# Patient Record
Sex: Female | Born: 1945 | Race: White | Hispanic: No | Smoking: Former smoker
Health system: Southern US, Community
[De-identification: ages and names within clinical notes are randomized; demographics above are authoritative.]

## PROBLEM LIST (undated history)

## (undated) DIAGNOSIS — IMO0002 Reserved for concepts with insufficient information to code with codable children: Secondary | ICD-10-CM

## (undated) DIAGNOSIS — M1990 Unspecified osteoarthritis, unspecified site: Secondary | ICD-10-CM

## (undated) DIAGNOSIS — D0510 Intraductal carcinoma in situ of unspecified breast: Secondary | ICD-10-CM

## (undated) DIAGNOSIS — R0602 Shortness of breath: Secondary | ICD-10-CM

## (undated) DIAGNOSIS — IMO0001 Reserved for inherently not codable concepts without codable children: Secondary | ICD-10-CM

## (undated) DIAGNOSIS — Z8489 Family history of other specified conditions: Secondary | ICD-10-CM

## (undated) DIAGNOSIS — I10 Essential (primary) hypertension: Secondary | ICD-10-CM

## (undated) DIAGNOSIS — H1189 Other specified disorders of conjunctiva: Secondary | ICD-10-CM

## (undated) DIAGNOSIS — J40 Bronchitis, not specified as acute or chronic: Secondary | ICD-10-CM

## (undated) DIAGNOSIS — C50919 Malignant neoplasm of unspecified site of unspecified female breast: Secondary | ICD-10-CM

## (undated) DIAGNOSIS — K219 Gastro-esophageal reflux disease without esophagitis: Secondary | ICD-10-CM

## (undated) DIAGNOSIS — E559 Vitamin D deficiency, unspecified: Secondary | ICD-10-CM

## (undated) DIAGNOSIS — E785 Hyperlipidemia, unspecified: Secondary | ICD-10-CM

## (undated) DIAGNOSIS — I499 Cardiac arrhythmia, unspecified: Secondary | ICD-10-CM

## (undated) DIAGNOSIS — Z923 Personal history of irradiation: Secondary | ICD-10-CM

## (undated) HISTORY — DX: Hyperlipidemia, unspecified: E78.5

## (undated) HISTORY — DX: Essential (primary) hypertension: I10

## (undated) HISTORY — DX: Cardiac arrhythmia, unspecified: I49.9

## (undated) HISTORY — DX: Vitamin D deficiency, unspecified: E55.9

## (undated) HISTORY — PX: BREAST BIOPSY: SHX20

## (undated) HISTORY — PX: TONSILLECTOMY: SUR1361

## (undated) HISTORY — DX: Reserved for inherently not codable concepts without codable children: IMO0001

## (undated) HISTORY — DX: Gilbert syndrome: E80.4

## (undated) HISTORY — DX: Other specified disorders of conjunctiva: H11.89

## (undated) HISTORY — PX: ABDOMINAL HYSTERECTOMY: SHX81

## (undated) HISTORY — DX: Malignant neoplasm of unspecified site of unspecified female breast: C50.919

## (undated) HISTORY — DX: Reserved for concepts with insufficient information to code with codable children: IMO0002

---

## 1980-10-12 HISTORY — PX: BREAST LUMPECTOMY: SHX2

## 2012-01-04 ENCOUNTER — Encounter (INDEPENDENT_AMBULATORY_CARE_PROVIDER_SITE_OTHER): Payer: Self-pay | Admitting: Surgery

## 2012-01-04 ENCOUNTER — Ambulatory Visit (INDEPENDENT_AMBULATORY_CARE_PROVIDER_SITE_OTHER): Payer: Medicare Other | Admitting: Surgery

## 2012-01-04 VITALS — BP 138/78 | HR 72 | Temp 97.2°F | Resp 18 | Ht 63.0 in | Wt 188.2 lb

## 2012-01-04 DIAGNOSIS — D0590 Unspecified type of carcinoma in situ of unspecified breast: Secondary | ICD-10-CM

## 2012-01-04 DIAGNOSIS — D0510 Intraductal carcinoma in situ of unspecified breast: Secondary | ICD-10-CM

## 2012-01-04 NOTE — Patient Instructions (Signed)
DUCTAL CARCINOMA IN SITU Return after MRI to further discuss surgery.

## 2012-01-04 NOTE — Progress Notes (Signed)
Patient ID: Paula [name], female   DOB: October 10, 1946, 66 y.o.   MRN: [id number]  Chief Complaint  Patient presents with  . Breast Cancer    eval Rt breast    HPI Paula [name] is a 66 y.o. female.   HPI The patient is sent at the request of Dr.Heist of [place]. The patient was noted to have a cluster of right breast microcalcifications in the upper-outer quadrant or pleomorphic. 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 Denies any history of breast mass. No family history of breast cancer. No history of breast pain, nipple discharge or mass. History of left breast biopsy 20 years ago for fibrocystic change.  Past Medical History  Diagnosis Date  . Cancer     rt breast  . Hypertension   . Hyperlipidemia   . Gilbert's syndrome   . Conjunctival disorder, other     Past Surgical History  Procedure Date  . Tonsillectomy   . Breast biopsy     right breast    Family History  Problem Relation Age of Onset  . Heart disease Father   . Heart disease Sister   . Heart disease Brother   . Cancer Maternal Aunt     [place]  . Heart disease Maternal Uncle     Social History History  Substance Use Topics  . Smoking status: Former Games developer  . Smokeless tobacco: Not on file  . Alcohol Use: No    Allergies  Allergen Reactions  . Amoxicillin Rash  . Ceclor (Cefaclor) Rash  . Doxycycline Rash    Current Outpatient Prescriptions  Medication Sig Dispense Refill  . albuterol (PROVENTIL HFA;VENTOLIN HFA) 108 (90 BASE) MCG/ACT inhaler Inhale 2 puffs into the lungs every 6 (six) hours as needed.      . beclomethasone (QVAR) 40 MCG/ACT inhaler Inhale 2 puffs into the lungs 2 (two) times daily.      Marland Kitchen CALCIUM PO Take by mouth.      . Fexofenadine HCl (ALLEGRA PO) Take by mouth daily.      . fish oil-omega-3 fatty acids 1000 MG capsule Take 2 g by mouth daily.      Marland Kitchen losartan-hydrochlorothiazide (HYZAAR) 100-25 MG per tablet Take 1  tablet by mouth daily.      . Misc Natural Products (PROGESTERONE EX) Apply topically as needed.      . mometasone (NASONEX) 50 MCG/ACT nasal spray Place 2 sprays into the nose daily.      . montelukast (SINGULAIR) 10 MG tablet Take 10 mg by mouth at bedtime.      . Multiple Vitamin (MULTIVITAMIN) capsule Take 1 capsule by mouth daily.      . Multiple Vitamins-Iron (CHLORELLA PO) Take by mouth.      . predniSONE (DELTASONE) 10 MG tablet as directed.      . simvastatin (ZOCOR) 10 MG tablet Take 10 mg by mouth at bedtime.      Marland Kitchen VITAMIN E PO Take by mouth daily.        Review of Systems Review of Systems  Constitutional: Negative for fever, chills and unexpected weight change.  HENT: Negative for hearing loss, congestion, sore throat, trouble swallowing and voice change.   Eyes: Negative for visual disturbance.  Respiratory: Negative for cough and wheezing.   Cardiovascular: Negative for chest pain, palpitations and leg swelling.  Gastrointestinal: Negative for nausea, vomiting, abdominal pain, diarrhea, constipation, blood in stool, abdominal distention and anal bleeding.  Genitourinary: Negative for hematuria, vaginal bleeding and difficulty urinating.  Musculoskeletal: Negative for arthralgias.  Skin: Negative for rash and wound.  Neurological: Negative for seizures, syncope and headaches.  Hematological: Negative for adenopathy. Does not bruise/bleed easily.  Psychiatric/Behavioral: Negative for confusion.    Blood pressure 138/78, pulse 72, temperature 97.2 F (36.2 C), temperature source Temporal, resp. rate 18, height 5\' 3"  (1.6 m), weight 188 lb 3.2 oz (85.367 kg).  Physical Exam Physical Exam  Constitutional: She appears well-developed and well-nourished.  HENT:  Head: Normocephalic and atraumatic.  Eyes: EOM are normal. Pupils are equal, round, and reactive to light.  Neck: Normal range of motion. Neck supple.  Cardiovascular: Normal rate and regular rhythm.     Pulmonary/Chest: Effort normal and breath sounds normal.       Right breast with small hematoma upper outer quadrant.  No other masses bilaterally.  No axillary lymphadenopathy.  Abdominal: Soft. Bowel sounds are normal.  Musculoskeletal: Normal range of motion.  Skin: Skin is warm and dry.  Psychiatric: She has a normal mood and affect. Her behavior is normal. Judgment and thought content normal.    Data Reviewed Mammogram and right breast biopsy results.  1 cm cluster right breast micro calcifications upper outer quadrant.  DCIS with comedo necrosis ER PR positive  Assessment    Right breast DCIS upper outer quadrant    Plan    We'll arrange for breast MRI. Return to clinic after this is done to discuss surgical options.       Artesha Wemhoff A. 01/04/2012, 2:56 PM

## 2012-01-11 ENCOUNTER — Ambulatory Visit
Admission: RE | Admit: 2012-01-11 | Discharge: 2012-01-11 | Disposition: A | Discharge status: Home / Self Care | Payer: Medicare Other | Source: Ambulatory Visit | Attending: Surgery | Admitting: Surgery

## 2012-01-11 DIAGNOSIS — D0510 Intraductal carcinoma in situ of unspecified breast: Secondary | ICD-10-CM

## 2012-01-11 MED ORDER — GADOBENATE DIMEGLUMINE 529 MG/ML IV SOLN
17.0000 mL | Freq: Once | INTRAVENOUS | Status: AC | PRN
  Administered 2012-01-11: 17 mL via INTRAVENOUS

## 2012-01-12 ENCOUNTER — Telehealth (INDEPENDENT_AMBULATORY_CARE_PROVIDER_SITE_OTHER): Payer: Self-pay | Admitting: Surgery

## 2012-01-12 NOTE — Telephone Encounter (Signed)
Patient called and would like to know if she will need to return to the office prior to surgery.

## 2012-01-22 ENCOUNTER — Ambulatory Visit (INDEPENDENT_AMBULATORY_CARE_PROVIDER_SITE_OTHER): Payer: Medicare Other | Admitting: Surgery

## 2012-01-22 ENCOUNTER — Encounter (INDEPENDENT_AMBULATORY_CARE_PROVIDER_SITE_OTHER): Payer: Self-pay | Admitting: Surgery

## 2012-01-22 VITALS — BP 140/82 | Ht 63.0 in | Wt 189.8 lb

## 2012-01-22 DIAGNOSIS — D0590 Unspecified type of carcinoma in situ of unspecified breast: Secondary | ICD-10-CM

## 2012-01-22 DIAGNOSIS — D0510 Intraductal carcinoma in situ of unspecified breast: Secondary | ICD-10-CM

## 2012-01-22 NOTE — Progress Notes (Signed)
Patient ID: Paula [name], female   DOB: 12/05/1945, 65 y.o.   MRN: [id number]  No chief complaint on file.   HPI Paula [name] is a 65 y.o. female.   HPI The patient is sent at the request of Dr.Heist of [place]. The patient was noted to have a cluster of right breast microcalcifications in the upper-outer quadrant or pleomorphic. 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 Denies any history of breast mass. No family history of breast cancer. No history of breast pain, nipple discharge or mass. History of left breast biopsy 20 years ago for fibrocystic change. She returns to  Talk about MRI results and treatment optons.  Past Medical History  Diagnosis Date  . Cancer     rt breast  . Hypertension   . Hyperlipidemia   . Gilbert's syndrome   . Conjunctival disorder, other     Past Surgical History  Procedure Date  . Tonsillectomy   . Breast biopsy     right breast    Family History  Problem Relation Age of Onset  . Heart disease Father   . Heart disease Sister   . Heart disease Brother   . Cancer Maternal Aunt     [place]  . Heart disease Maternal Uncle     Social History History  Substance Use Topics  . Smoking status: Former Smoker  . Smokeless tobacco: Not on file  . Alcohol Use: No    Allergies  Allergen Reactions  . Amoxicillin Rash  . Ceclor (Cefaclor) Rash  . Doxycycline Rash    Current Outpatient Prescriptions  Medication Sig Dispense Refill  . albuterol (PROVENTIL HFA;VENTOLIN HFA) 108 (90 BASE) MCG/ACT inhaler Inhale 2 puffs into the lungs every 6 (six) hours as needed.      . beclomethasone (QVAR) 40 MCG/ACT inhaler Inhale 2 puffs into the lungs 2 (two) times daily.      . CALCIUM PO Take by mouth.      . Fexofenadine HCl (ALLEGRA PO) Take by mouth daily.      . fish oil-omega-3 fatty acids 1000 MG capsule Take 2 g by mouth daily.      . losartan-hydrochlorothiazide (HYZAAR) 100-25 MG per tablet  Take 1 tablet by mouth daily.      . Misc Natural Products (PROGESTERONE EX) Apply topically as needed.      . mometasone (NASONEX) 50 MCG/ACT nasal spray Place 2 sprays into the nose daily.      . montelukast (SINGULAIR) 10 MG tablet Take 10 mg by mouth at bedtime.      . Multiple Vitamin (MULTIVITAMIN) capsule Take 1 capsule by mouth daily.      . Multiple Vitamins-Iron (CHLORELLA PO) Take by mouth.      . predniSONE (DELTASONE) 10 MG tablet as directed.      . simvastatin (ZOCOR) 10 MG tablet Take 10 mg by mouth at bedtime.      . VITAMIN E PO Take by mouth daily.        Review of Systems Review of Systems  Constitutional: Negative for fever, chills and unexpected weight change.  HENT: Negative for hearing loss, congestion, sore throat, trouble swallowing and voice change.   Eyes: Negative for visual disturbance.  Respiratory: Negative for cough and wheezing.   Cardiovascular: Negative for chest pain, palpitations and leg swelling.  Gastrointestinal: Negative for nausea, vomiting, abdominal pain, diarrhea, constipation, blood in stool, abdominal distention and anal bleeding.  Genitourinary:   Negative for hematuria, vaginal bleeding and difficulty urinating.  Musculoskeletal: Negative for arthralgias.  Skin: Negative for rash and wound.  Neurological: Negative for seizures, syncope and headaches.  Hematological: Negative for adenopathy. Does not bruise/bleed easily.  Psychiatric/Behavioral: Negative for confusion.    Blood pressure 140/82, height 5' 3" (1.6 m), weight 189 lb 12.8 oz (86.093 kg).  Physical Exam Physical Exam  Constitutional: She appears well-developed and well-nourished.  HENT:  Head: Normocephalic and atraumatic.  Eyes: EOM are normal. Pupils are equal, round, and reactive to light.  Neck: Normal range of motion. Neck supple.  Cardiovascular: Normal rate and regular rhythm.   Pulmonary/Chest: Effort normal and breath sounds normal.       Right breast with small  hematoma upper outer quadrant.  No other masses bilaterally.  No axillary lymphadenopathy.  Abdominal: Soft. Bowel sounds are normal.  Musculoskeletal: Normal range of motion.  Skin: Skin is warm and dry.  Psychiatric: She has a normal mood and affect. Her behavior is normal. Judgment and thought content normal.    Data Reviewed Mammogram and right breast biopsy results.  1 cm cluster right breast micro calcifications upper outer quadrant.  DCIS with comedo necrosis ER PR positive  MRI shows no additional enhancement.  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 Alternatives to surgery have been discussed with the patient.  Risks of surgery include bleeding,  Infection,  Seroma formation, death,  and the need for further surgery.   The patient understands and wishes to proceed.       Jair Lindblad A. 01/22/2012, 12:33 PM    

## 2012-01-22 NOTE — Patient Instructions (Signed)
Lumpectomy, Breast Conserving Surgery A lumpectomy is breast surgery that removes only part of the breast. Another name used may be partial mastectomy. The amount removed varies. Make sure you understand how much of your breast will be removed. Reasons for a lumpectomy:  Any solid breast mass.   Grouped significant nodularity that may be confused with a solitary breast mass.  Lumpectomy is the most common form of breast cancer surgery today. The surgeon removes the portion of your breast which contains the tumor (cancer). This is the lump. Some normal tissue around the lump is also removed to be sure that all the tumor has been removed.  If cancer cells are found in the margins where the breast tissue was removed, your surgeon will do more surgery to remove the remaining cancer tissue. This is called re-excision surgery. Radiation and/or chemotherapy treatments are often given following a lumpectomy to kill any cancer cells that could possibly remain.  REASONS YOU MAY NOT BE ABLE TO HAVE BREAST CONSERVING SURGERY:  The tumor is located in more than one place.   Your breast is small and the tumor is large so the breast would be disfigured.   The entire tumor removal is not successful with a lumpectomy.   You cannot commit to a full course of chemotherapy, radiation therapy or are pregnant and cannot have radiation.   You have previously had radiation to the breast to treat cancer.  HOW A LUMPECTOMY IS PERFORMED If overnight nursing is not required following a biopsy, a lumpectomy can be performed as a same-day surgery. This can be done in a hospital, clinic, or surgical center. The anesthesia used will depend on your surgeon. They will discuss this with you. A general anesthetic keeps you sleeping through the procedure. LET YOUR CAREGIVERS KNOW ABOUT THE FOLLOWING:  Allergies   Medications taken including herbs, eye drops, over the counter medications, and creams.   Use of steroids (by  mouth or creams)   Previous problems with anesthetics or Novocaine.   Possibility of pregnancy, if this applies   History of blood clots (thrombophlebitis)   History of bleeding or blood problems.   Previous surgery   Other health problems  BEFORE THE PROCEDURE You should be present one hour prior to your procedure unless directed otherwise.  AFTER THE PROCEDURE  After surgery, you will be taken to the recovery area where a nurse will watch and check your progress. Once you're awake, stable, and taking fluids well, barring other problems you will be allowed to go home.   Ice packs applied to your operative site may help with discomfort and keep the swelling down.   A small rubber drain may be placed in the breast for a couple of days to prevent a hematoma from developing in the breast.   A pressure dressing may be applied for 24 to 48 hours to prevent bleeding.   Keep the wound dry.   You may resume a normal diet and activities as directed. Avoid strenuous activities affecting the arm on the side of the biopsy site such as tennis, swimming, heavy lifting (more than 10 pounds) or pulling.   Bruising in the breast is normal following this procedure.   Wearing a bra - even to bed - may be more comfortable and also help keep the dressing on.   Change dressings as directed.   Only take over-the-counter or prescription medicines for pain, discomfort, or fever as directed by your caregiver.  Call for your results as   instructed by your surgeon. Remember it is your responsibility to get the results of your lumpectomy if your surgeon asked you to follow-up. Do not assume everything is fine if you have not heard from your caregiver. SEEK MEDICAL CARE IF:   There is increased bleeding (more than a small spot) from the wound.   You notice redness, swelling, or increasing pain in the wound.   Pus is coming from wound.   An unexplained oral temperature above 102 F (38.9 C) develops.     You notice a foul smell coming from the wound or dressing.  SEEK IMMEDIATE MEDICAL CARE IF:   You develop a rash.   You have difficulty breathing.   You have any allergic problems.  Document Released: 11/09/2006 Document Revised: 09/17/2011 Document Reviewed: 02/10/2007 [employer] Patient Information 2012 [employer]. 

## 2012-01-25 ENCOUNTER — Other Ambulatory Visit (INDEPENDENT_AMBULATORY_CARE_PROVIDER_SITE_OTHER): Payer: Self-pay | Admitting: Surgery

## 2012-01-25 DIAGNOSIS — D0510 Intraductal carcinoma in situ of unspecified breast: Secondary | ICD-10-CM

## 2012-02-02 ENCOUNTER — Encounter (INDEPENDENT_AMBULATORY_CARE_PROVIDER_SITE_OTHER): Payer: Medicare Other | Admitting: Surgery

## 2012-02-03 ENCOUNTER — Encounter (HOSPITAL_BASED_OUTPATIENT_CLINIC_OR_DEPARTMENT_OTHER): Payer: Self-pay | Admitting: *Deleted

## 2012-02-03 NOTE — Progress Notes (Signed)
To come in for ccs labs and cxr,ekg

## 2012-02-04 ENCOUNTER — Other Ambulatory Visit: Payer: Self-pay

## 2012-02-04 ENCOUNTER — Encounter (HOSPITAL_BASED_OUTPATIENT_CLINIC_OR_DEPARTMENT_OTHER)
Admission: RE | Admit: 2012-02-04 | Discharge: 2012-02-04 | Disposition: A | Discharge status: Home / Self Care | Payer: Medicare Other | Source: Ambulatory Visit | Attending: Surgery | Admitting: Surgery

## 2012-02-04 ENCOUNTER — Ambulatory Visit
Admission: RE | Admit: 2012-02-04 | Discharge: 2012-02-04 | Disposition: A | Discharge status: Home / Self Care | Payer: Medicare Other | Source: Ambulatory Visit | Attending: Surgery | Admitting: Surgery

## 2012-02-04 LAB — DIFFERENTIAL
Basophils Absolute: 0 10*3/uL (ref 0.0–0.1)
Lymphocytes Relative: 32 % (ref 12–46)
Monocytes Absolute: 0.6 10*3/uL (ref 0.1–1.0)
Neutro Abs: 3.3 10*3/uL (ref 1.7–7.7)

## 2012-02-04 LAB — COMPREHENSIVE METABOLIC PANEL
AST: 21 U/L (ref 0–37)
Albumin: 4 g/dL (ref 3.5–5.2)
CO2: 27 mEq/L (ref 19–32)
Calcium: 9.6 mg/dL (ref 8.4–10.5)
Creatinine, Ser: 0.73 mg/dL (ref 0.50–1.10)
GFR calc non Af Amer: 88 mL/min — ABNORMAL LOW (ref >90)
Total Protein: 6.9 g/dL (ref 6.0–8.3)

## 2012-02-04 LAB — CBC
MCH: 30.8 pg (ref 26.0–34.0)
MCHC: 33.5 g/dL (ref 30.0–36.0)
MCV: 91.8 fL (ref 78.0–100.0)
Platelets: 282 10*3/uL (ref 150–400)
RDW: 13.4 % (ref 11.5–15.5)

## 2012-02-08 ENCOUNTER — Encounter (HOSPITAL_BASED_OUTPATIENT_CLINIC_OR_DEPARTMENT_OTHER): Payer: Self-pay | Admitting: Anesthesiology

## 2012-02-08 ENCOUNTER — Encounter (HOSPITAL_BASED_OUTPATIENT_CLINIC_OR_DEPARTMENT_OTHER): Payer: Self-pay | Admitting: *Deleted

## 2012-02-08 ENCOUNTER — Ambulatory Visit
Admission: RE | Admit: 2012-02-08 | Discharge: 2012-02-08 | Disposition: A | Discharge status: Home / Self Care | Payer: Medicare Other | Source: Ambulatory Visit | Attending: Surgery | Admitting: Surgery

## 2012-02-08 ENCOUNTER — Encounter (HOSPITAL_BASED_OUTPATIENT_CLINIC_OR_DEPARTMENT_OTHER)
Admission: RE | Disposition: A | Discharge status: Home / Self Care | Payer: Self-pay | Source: Ambulatory Visit | Attending: Surgery

## 2012-02-08 ENCOUNTER — Encounter (HOSPITAL_BASED_OUTPATIENT_CLINIC_OR_DEPARTMENT_OTHER): Payer: Self-pay | Admitting: Surgery

## 2012-02-08 ENCOUNTER — Ambulatory Visit (HOSPITAL_BASED_OUTPATIENT_CLINIC_OR_DEPARTMENT_OTHER)
Admission: RE | Admit: 2012-02-08 | Discharge: 2012-02-08 | Disposition: A | Discharge status: Home / Self Care | Payer: Medicare Other | Source: Ambulatory Visit | Attending: Surgery | Admitting: Surgery

## 2012-02-08 ENCOUNTER — Ambulatory Visit (HOSPITAL_BASED_OUTPATIENT_CLINIC_OR_DEPARTMENT_OTHER): Payer: Medicare Other | Admitting: Anesthesiology

## 2012-02-08 DIAGNOSIS — E785 Hyperlipidemia, unspecified: Secondary | ICD-10-CM | POA: Insufficient documentation

## 2012-02-08 DIAGNOSIS — M129 Arthropathy, unspecified: Secondary | ICD-10-CM | POA: Insufficient documentation

## 2012-02-08 DIAGNOSIS — K219 Gastro-esophageal reflux disease without esophagitis: Secondary | ICD-10-CM | POA: Insufficient documentation

## 2012-02-08 DIAGNOSIS — D0510 Intraductal carcinoma in situ of unspecified breast: Secondary | ICD-10-CM

## 2012-02-08 DIAGNOSIS — Z170 Estrogen receptor positive status [ER+]: Secondary | ICD-10-CM | POA: Insufficient documentation

## 2012-02-08 DIAGNOSIS — R0602 Shortness of breath: Secondary | ICD-10-CM | POA: Insufficient documentation

## 2012-02-08 DIAGNOSIS — D0590 Unspecified type of carcinoma in situ of unspecified breast: Secondary | ICD-10-CM

## 2012-02-08 DIAGNOSIS — I10 Essential (primary) hypertension: Secondary | ICD-10-CM | POA: Insufficient documentation

## 2012-02-08 HISTORY — DX: Bronchitis, not specified as acute or chronic: J40

## 2012-02-08 HISTORY — DX: Unspecified osteoarthritis, unspecified site: M19.90

## 2012-02-08 HISTORY — DX: Shortness of breath: R06.02

## 2012-02-08 HISTORY — DX: Gastro-esophageal reflux disease without esophagitis: K21.9

## 2012-02-08 SURGERY — BREAST LUMPECTOMY WITH NEEDLE LOCALIZATION
Anesthesia: General | Site: Breast | Laterality: Right | Wound class: Clean

## 2012-02-08 MED ORDER — BUPIVACAINE-EPINEPHRINE 0.25% -1:200000 IJ SOLN
INTRAMUSCULAR | Status: DC | PRN
  Administered 2012-02-08: 12 mL

## 2012-02-08 MED ORDER — MIDAZOLAM HCL 5 MG/5ML IJ SOLN
INTRAMUSCULAR | Status: DC | PRN
  Administered 2012-02-08: 1 mg via INTRAVENOUS

## 2012-02-08 MED ORDER — VANCOMYCIN HCL IN DEXTROSE 1-5 GM/200ML-% IV SOLN
1000.0000 mg | INTRAVENOUS | Status: AC
  Administered 2012-02-08: 1000 mg via INTRAVENOUS

## 2012-02-08 MED ORDER — PROPOFOL 10 MG/ML IV EMUL
INTRAVENOUS | Status: DC | PRN
  Administered 2012-02-08: 200 mg via INTRAVENOUS

## 2012-02-08 MED ORDER — ONDANSETRON HCL 4 MG/2ML IJ SOLN
INTRAMUSCULAR | Status: DC | PRN
  Administered 2012-02-08: 4 mg via INTRAVENOUS

## 2012-02-08 MED ORDER — ONDANSETRON HCL 4 MG/2ML IJ SOLN: 4.0000 mg | Freq: Four times a day (QID) | INTRAMUSCULAR | Status: DC | PRN

## 2012-02-08 MED ORDER — LIDOCAINE HCL (CARDIAC) 20 MG/ML IV SOLN
INTRAVENOUS | Status: DC | PRN
  Administered 2012-02-08: 100 mg via INTRAVENOUS

## 2012-02-08 MED ORDER — OXYCODONE-ACETAMINOPHEN 5-325 MG PO TABS: 1.0000 | ORAL_TABLET | ORAL | Status: AC | PRN

## 2012-02-08 MED ORDER — HYDROMORPHONE HCL PF 1 MG/ML IJ SOLN
0.2500 mg | INTRAMUSCULAR | Status: DC | PRN
Start: 2012-02-08 — End: 2012-02-08

## 2012-02-08 MED ORDER — DEXAMETHASONE SODIUM PHOSPHATE 4 MG/ML IJ SOLN
INTRAMUSCULAR | Status: DC | PRN
  Administered 2012-02-08: 10 mg via INTRAVENOUS

## 2012-02-08 MED ORDER — FENTANYL CITRATE 0.05 MG/ML IJ SOLN
INTRAMUSCULAR | Status: DC | PRN
  Administered 2012-02-08: 100 ug via INTRAVENOUS
  Administered 2012-02-08 (×2): 25 ug via INTRAVENOUS

## 2012-02-08 MED ORDER — LACTATED RINGERS IV SOLN
INTRAVENOUS | Status: DC
  Administered 2012-02-08 (×2): via INTRAVENOUS

## 2012-02-08 MED ORDER — CHLORHEXIDINE GLUCONATE 4 % EX LIQD: 1.0000 "application " | Freq: Once | CUTANEOUS | Status: DC

## 2012-02-08 MED ORDER — OXYCODONE-ACETAMINOPHEN 5-325 MG PO TABS
1.0000 | ORAL_TABLET | Freq: Once | ORAL | Status: AC
  Administered 2012-02-08: 1 via ORAL

## 2012-02-08 SURGICAL SUPPLY — 40 items
BLADE SURG 15 STRL LF DISP TIS (BLADE) ×1 IMPLANT
BLADE SURG 15 STRL SS (BLADE) ×1
CANISTER SUCTION 1200CC (MISCELLANEOUS) ×2 IMPLANT
CHLORAPREP W/TINT 26ML (MISCELLANEOUS) ×2 IMPLANT
CLIP TI WIDE RED SMALL 6 (CLIP) ×2 IMPLANT
CLOTH BEACON ORANGE TIMEOUT ST (SAFETY) ×2 IMPLANT
COVER MAYO STAND STRL (DRAPES) ×2 IMPLANT
COVER TABLE BACK 60X90 (DRAPES) ×2 IMPLANT
DECANTER SPIKE VIAL GLASS SM (MISCELLANEOUS) IMPLANT
DERMABOND ADVANCED (GAUZE/BANDAGES/DRESSINGS) ×1
DERMABOND ADVANCED .7 DNX12 (GAUZE/BANDAGES/DRESSINGS) ×1 IMPLANT
DEVICE DUBIN W/COMP PLATE 8390 (MISCELLANEOUS) IMPLANT
DRAPE LAPAROSCOPIC ABDOMINAL (DRAPES) IMPLANT
DRAPE PED LAPAROTOMY (DRAPES) ×2 IMPLANT
DRAPE UTILITY XL STRL (DRAPES) ×2 IMPLANT
ELECT COATED BLADE 2.86 ST (ELECTRODE) ×2 IMPLANT
ELECT REM PT RETURN 9FT ADLT (ELECTROSURGICAL) ×2
ELECTRODE REM PT RTRN 9FT ADLT (ELECTROSURGICAL) ×1 IMPLANT
GLOVE BIO SURGEON STRL SZ 6.5 (GLOVE) ×2 IMPLANT
GLOVE BIOGEL PI IND STRL 8 (GLOVE) ×1 IMPLANT
GLOVE BIOGEL PI INDICATOR 8 (GLOVE) ×1
GLOVE ECLIPSE 8.0 STRL XLNG CF (GLOVE) ×4 IMPLANT
GOWN PREVENTION PLUS XLARGE (GOWN DISPOSABLE) ×4 IMPLANT
KIT MARKER MARGIN INK (KITS) ×2 IMPLANT
NEEDLE HYPO 25X1 1.5 SAFETY (NEEDLE) ×2 IMPLANT
NS IRRIG 1000ML POUR BTL (IV SOLUTION) ×2 IMPLANT
PACK BASIN DAY SURGERY FS (CUSTOM PROCEDURE TRAY) ×2 IMPLANT
PENCIL BUTTON HOLSTER BLD 10FT (ELECTRODE) ×2 IMPLANT
SLEEVE SCD COMPRESS KNEE MED (MISCELLANEOUS) ×2 IMPLANT
SPONGE LAP 4X18 X RAY DECT (DISPOSABLE) ×2 IMPLANT
SUT MON AB 4-0 PC3 18 (SUTURE) ×2 IMPLANT
SUT SILK 2 0 SH (SUTURE) IMPLANT
SUT VIC AB 3-0 SH 27 (SUTURE) ×1
SUT VIC AB 3-0 SH 27X BRD (SUTURE) ×1 IMPLANT
SYR CONTROL 10ML LL (SYRINGE) ×2 IMPLANT
TOWEL OR 17X24 6PK STRL BLUE (TOWEL DISPOSABLE) ×4 IMPLANT
TOWEL OR NON WOVEN STRL DISP B (DISPOSABLE) ×2 IMPLANT
TUBE CONNECTING 20X1/4 (TUBING) ×2 IMPLANT
WATER STERILE IRR 1000ML POUR (IV SOLUTION) IMPLANT
YANKAUER SUCT BULB TIP NO VENT (SUCTIONS) ×2 IMPLANT

## 2012-02-08 NOTE — Discharge Instructions (Signed)
[facility] °Office Phone Number [phone] ° °BREAST BIOPSY/ PARTIAL MASTECTOMY: POST OP INSTRUCTIONS ° °Always review your discharge instruction sheet given to you by the facility where your surgery was performed. ° °IF YOU HAVE DISABILITY OR FAMILY LEAVE FORMS, YOU MUST BRING THEM TO THE OFFICE FOR PROCESSING.  DO NOT GIVE THEM TO YOUR DOCTOR. ° °1. A prescription for pain medication may be given to you upon discharge.  Take your pain medication as prescribed, if needed.  If narcotic pain medicine is not needed, then you may take acetaminophen (Tylenol) or ibuprofen (Advil) as needed. °2. Take your usually prescribed medications unless otherwise directed °3. If you need a refill on your pain medication, please contact your pharmacy.  They will contact our office to request authorization.  Prescriptions will not be filled after 5pm or on week-ends. °4. You should eat very light the first 24 hours after surgery, such as soup, crackers, pudding, etc.  Resume your normal diet the day after surgery. °5. Most patients will experience some swelling and bruising in the breast.  Ice packs and a good support bra will help.  Swelling and bruising can take several days to resolve.  °6. It is common to experience some constipation if taking pain medication after surgery.  Increasing fluid intake and taking a stool softener will usually help or prevent this problem from occurring.  A mild laxative (Milk of Magnesia or Miralax) should be taken according to package directions if there are no bowel movements after 48 hours. °7. Unless discharge instructions indicate otherwise, you may remove your bandages 24-48 hours after surgery, and you may shower at that time.  You may have steri-strips (small skin tapes) in place directly over the incision.  These strips should be left on the skin for 7-10 days.  If your surgeon used skin glue on the incision, you may shower in 24 hours.  The glue will flake off over the  next 2-3 weeks.  Any sutures or staples will be removed at the office during your follow-up visit. °8. ACTIVITIES:  You may resume regular daily activities (gradually increasing) beginning the next day.  Wearing a good support bra or sports bra minimizes pain and swelling.  You may have sexual intercourse when it is comfortable. °a. You may drive when you no longer are taking prescription pain medication, you can comfortably wear a seatbelt, and you can safely maneuver your car and apply brakes. °b. RETURN TO WORK:  ______________________________________________________________________________________ °9. You should see your doctor in the office for a follow-up appointment approximately two weeks after your surgery.  Your doctor’s nurse will typically make your follow-up appointment when she calls you with your pathology report.  Expect your pathology report 2-3 business days after your surgery.  You may call to check if you do not hear from us after three days. °10. OTHER INSTRUCTIONS: _______________________________________________________________________________________________ _____________________________________________________________________________________________________________________________________ °_____________________________________________________________________________________________________________________________________ °_____________________________________________________________________________________________________________________________________ ° °WHEN TO CALL YOUR DOCTOR: °1. Fever over 101.0 °2. Nausea and/or vomiting. °3. Extreme swelling or bruising. °4. Continued bleeding from incision. °5. Increased pain, redness, or drainage from the incision. ° °The clinic staff is available to answer your questions during regular business hours.  Please don’t hesitate to call and ask to speak to one of the nurses for clinical concerns.  If you have a medical emergency, go to the nearest  emergency room or call 911.  A surgeon from [facility] is always on call at the hospital. ° °For further questions, please visit [email or web address]  ° ° °  Post Anesthesia Home Care Instructions ° °Activity: °Get plenty of rest for the remainder of the day. A responsible adult should stay with you for 24 hours following the procedure.  °For the next 24 hours, DO NOT: °-Drive a car °-Operate machinery °-Drink alcoholic beverages °-Take any medication unless instructed by your physician °-Make any legal decisions or sign important papers. ° °Meals: °Start with liquid foods such as gelatin or soup. Progress to regular foods as tolerated. Avoid greasy, spicy, heavy foods. If nausea and/or vomiting occur, drink only clear liquids until the nausea and/or vomiting subsides. Call your physician if vomiting continues. ° °Special Instructions/Symptoms: °Your throat may feel dry or sore from the anesthesia or the breathing tube placed in your throat during surgery. If this causes discomfort, gargle with warm salt water. The discomfort should disappear within 24 hours. ° °

## 2012-02-08 NOTE — Anesthesia Postprocedure Evaluation (Signed)
Anesthesia Post Note  Patient: Paula [name]  Procedure(s) Performed: Procedure(s) (LRB): BREAST LUMPECTOMY WITH NEEDLE LOCALIZATION (Right)  Anesthesia type: General  Patient location: PACU  Post pain: Pain level controlled and Adequate analgesia  Post assessment: Post-op Vital signs reviewed, Patient's Cardiovascular Status Stable, Respiratory Function Stable, Patent Airway and Pain level controlled  Last Vitals:  Filed Vitals:   02/08/12 1545  BP: 156/84  Pulse: 62  Temp:   Resp: 17    Post vital signs: Reviewed and stable  Level of consciousness: awake, alert  and oriented  Complications: No apparent anesthesia complications

## 2012-02-08 NOTE — Anesthesia Preprocedure Evaluation (Signed)
Anesthesia Evaluation  Patient identified by MRN, date of birth, ID band Patient awake    Reviewed: Allergy & Precautions, H&P , NPO status , Patient's Chart, lab work & pertinent test results  Airway Mallampati: II  Neck ROM: full    Dental   Pulmonary shortness of breath,          Cardiovascular hypertension,     Neuro/Psych    GI/Hepatic GERD-  ,  Endo/Other    Renal/GU      Musculoskeletal  (+) Arthritis -,   Abdominal   Peds  Hematology   Anesthesia Other Findings   Reproductive/Obstetrics                           Anesthesia Physical Anesthesia Plan  ASA: II  Anesthesia Plan: General   Post-op Pain Management:    Induction: Intravenous  Airway Management Planned: LMA  Additional Equipment:   Intra-op Plan:   Post-operative Plan:   Informed Consent: I have reviewed the patients History and Physical, chart, labs and discussed the procedure including the risks, benefits and alternatives for the proposed anesthesia with the patient or authorized representative who has indicated his/her understanding and acceptance.     Plan Discussed with: CRNA and Surgeon  Anesthesia Plan Comments:         Anesthesia Quick Evaluation

## 2012-02-08 NOTE — Transfer of Care (Signed)
Immediate Anesthesia Transfer of Care Note  Patient: Paula [name]  Procedure(s) Performed: Procedure(s) (LRB): BREAST LUMPECTOMY WITH NEEDLE LOCALIZATION (Right)  Patient Location: PACU  Anesthesia Type: General  Level of Consciousness: awake, alert  and oriented  Airway & Oxygen Therapy: Patient Spontanous Breathing and Patient connected to face mask oxygen  Post-op Assessment: Report given to PACU RN and Post -op Vital signs reviewed and stable  Post vital signs: Reviewed and stable  Complications: No apparent anesthesia complications

## 2012-02-08 NOTE — H&P (View-Only) (Signed)
Patient ID: Paula [name], female   DOB: Aug 28, 1946, 66 y.o.   MRN: [id number]  No chief complaint on file.   HPI Paula [name] is a 66 y.o. female.   HPI The patient is sent at the request of Dr.Heist of [place]. The patient was noted to have a cluster of right breast microcalcifications in the upper-outer quadrant or pleomorphic. 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 Denies any history of breast mass. No family history of breast cancer. No history of breast pain, nipple discharge or mass. History of left breast biopsy 20 years ago for fibrocystic change. She returns to  Talk about MRI results and treatment optons.  Past Medical History  Diagnosis Date  . Cancer     rt breast  . Hypertension   . Hyperlipidemia   . Gilbert's syndrome   . Conjunctival disorder, other     Past Surgical History  Procedure Date  . Tonsillectomy   . Breast biopsy     right breast    Family History  Problem Relation Age of Onset  . Heart disease Father   . Heart disease Sister   . Heart disease Brother   . Cancer Maternal Aunt     unaware  . Heart disease Maternal Uncle     Social History History  Substance Use Topics  . Smoking status: Former Games developer  . Smokeless tobacco: Not on file  . Alcohol Use: No    Allergies  Allergen Reactions  . Amoxicillin Rash  . Ceclor (Cefaclor) Rash  . Doxycycline Rash    Current Outpatient Prescriptions  Medication Sig Dispense Refill  . albuterol (PROVENTIL HFA;VENTOLIN HFA) 108 (90 BASE) MCG/ACT inhaler Inhale 2 puffs into the lungs every 6 (six) hours as needed.      . beclomethasone (QVAR) 40 MCG/ACT inhaler Inhale 2 puffs into the lungs 2 (two) times daily.      Marland Kitchen CALCIUM PO Take by mouth.      . Fexofenadine HCl (ALLEGRA PO) Take by mouth daily.      . fish oil-omega-3 fatty acids 1000 MG capsule Take 2 g by mouth daily.      Marland Kitchen losartan-hydrochlorothiazide (HYZAAR) 100-25 MG per tablet  Take 1 tablet by mouth daily.      . Misc Natural Products (PROGESTERONE EX) Apply topically as needed.      . mometasone (NASONEX) 50 MCG/ACT nasal spray Place 2 sprays into the nose daily.      . montelukast (SINGULAIR) 10 MG tablet Take 10 mg by mouth at bedtime.      . Multiple Vitamin (MULTIVITAMIN) capsule Take 1 capsule by mouth daily.      . Multiple Vitamins-Iron (CHLORELLA PO) Take by mouth.      . predniSONE (DELTASONE) 10 MG tablet as directed.      . simvastatin (ZOCOR) 10 MG tablet Take 10 mg by mouth at bedtime.      Marland Kitchen VITAMIN E PO Take by mouth daily.        Review of Systems Review of Systems  Constitutional: Negative for fever, chills and unexpected [name] change.  HENT: Negative for hearing loss, congestion, sore throat, trouble swallowing and voice change.   Eyes: Negative for visual disturbance.  Respiratory: Negative for cough and wheezing.   Cardiovascular: Negative for chest pain, palpitations and leg swelling.  Gastrointestinal: Negative for nausea, vomiting, abdominal pain, diarrhea, constipation, blood in stool, abdominal distention and anal bleeding.  Genitourinary:  Negative for hematuria, vaginal bleeding and difficulty urinating.  Musculoskeletal: Negative for arthralgias.  Skin: Negative for rash and wound.  Neurological: Negative for seizures, syncope and headaches.  Hematological: Negative for adenopathy. Does not bruise/bleed easily.  Psychiatric/Behavioral: Negative for confusion.    Blood pressure 140/82, height 5\' 3"  (1.6 m), [name] 189 lb 12.8 oz (86.093 kg).  Physical Exam Physical Exam  Constitutional: She appears well-developed and well-nourished.  HENT:  Head: Normocephalic and atraumatic.  Eyes: EOM are normal. Pupils are equal, round, and reactive to light.  Neck: Normal range of motion. Neck supple.  Cardiovascular: Normal rate and regular rhythm.   Pulmonary/Chest: Effort normal and breath sounds normal.       Right breast with small  hematoma upper outer quadrant.  No other masses bilaterally.  No axillary lymphadenopathy.  Abdominal: Soft. Bowel sounds are normal.  Musculoskeletal: Normal range of motion.  Skin: Skin is warm and dry.  Psychiatric: She has a normal mood and affect. Her behavior is normal. Judgment and thought content normal.    Data Reviewed Mammogram and right breast biopsy results.  1 cm cluster right breast micro calcifications upper outer quadrant.  DCIS with comedo necrosis ER PR positive  MRI shows no additional enhancement.  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 Alternatives to surgery have been discussed with the patient.  Risks of surgery include bleeding,  Infection,  Seroma formation, death,  and the need for further surgery.   The patient understands and wishes to proceed.       Paula [name] A. 01/22/2012, 12:33 PM

## 2012-02-08 NOTE — Op Note (Signed)
Breast Lumpectomy Right Needle Localization  Indications: This patient presents with history of a right breast DCIS. Given the clinical history and physical exam, along with indicated diagnostic studies, breast lumpectomy will be performed after workup showed a 2 cm area of DCIS.  The patient wished breast conservation.The procedure has been discussed with the patient. Alternatives to surgery have been discussed with the patient.  Risks of surgery include bleeding,  Infection,  Seroma formation, death,  and the need for further surgery.   The patient understands and wishes to proceed..  Pre-operative Diagnosis: right breast DCIS  Post-operative Diagnosis: right breast dcis  Surgeon: Aniko Finnigan A.   Assistants: OR  Anesthesia: General LMA anesthesia and Local anesthesia 0.25.% bupivacaine, with epinephrine  ASA Class: 2  Procedure Details  The patient was seen in the Holding Room. The risks, benefits, complications, treatment options, and expected outcomes were discussed with the patient. The possibilities of reaction to medication, pulmonary aspiration, bleeding, infection, the need for additional procedures, failure to diagnose a condition, and creating a complication requiring transfusion or operation were discussed with the patient. The patient concurred with the proposed plan, giving informed consent. The site of surgery properly noted/marked. The patient was taken to Operating Room, identified as Paula [name], and the procedure verified as lumpectomy. A Time Out was held and the above information confirmed.  After induction of anesthesia, the right breast and chest were prepped and draped in standard fashion. The lumpectomy was performed by creating an oblique incision over the upper outer quadrant of the breast  Adjacent to the localizing wire. All tissue around the wire was excised and radiograph showed the wire,  Clip and calcifications in the specimen and hemostasis was achieved  with cautery.  Additional tissue was taken along the superior margin and submitted separately to pathology after providing orientation for the pathology.  Clips marked the cavity. The wound was irrigated and closed with a 3-0 Vicryl and 4 o monocryl subcuticular closure in layers.    Sterile dressings were applied. At the end of the operation, all sponge, instrument, and needle counts were correct.  Findings: grossly clear surgical margins  Estimated Blood Loss:  less than 50 mL         Drains: None         Total IV Fluids: 500 mL         Specimens: breast mass             Complications:  None; patient tolerated the procedure well.         Disposition: PACU - hemodynamically stable.         Condition: Stable

## 2012-02-08 NOTE — Interval H&P Note (Signed)
History and Physical Interval Note:  02/08/2012 2:29 PM  Paula [name]  has presented today for surgery, with the diagnosis of right breast DCIS  The various methods of treatment have been discussed with the patient and family. After consideration of risks, benefits and other options for treatment, the patient has consented to  Procedure(s) (LRB): BREAST LUMPECTOMY WITH NEEDLE LOCALIZATION (Right) as a surgical intervention .  The patients' history has been reviewed, patient examined, no change in status, stable for surgery.  I have reviewed the patients' chart and labs.  Questions were answered to the patient's satisfaction.     Jami Bogdanski A.

## 2012-02-08 NOTE — Anesthesia Procedure Notes (Signed)
Procedure Name: LMA Insertion Performed by: Leilah Polimeni W Pre-anesthesia Checklist: Patient identified, Timeout performed, Emergency Drugs available, Suction available and Patient being monitored Patient Re-evaluated:Patient Re-evaluated prior to inductionOxygen Delivery Method: Circle system utilized Preoxygenation: Pre-oxygenation with 100% oxygen Intubation Type: IV induction Ventilation: Mask ventilation without difficulty LMA: LMA with gastric port inserted LMA Size: 4.0 Number of attempts: 1 Placement Confirmation: breath sounds checked- equal and bilateral and positive ETCO2 Tube secured with: Tape Dental Injury: Teeth and Oropharynx as per pre-operative assessment      

## 2012-02-25 ENCOUNTER — Ambulatory Visit (INDEPENDENT_AMBULATORY_CARE_PROVIDER_SITE_OTHER): Payer: Medicare Other | Admitting: Surgery

## 2012-02-25 ENCOUNTER — Encounter (INDEPENDENT_AMBULATORY_CARE_PROVIDER_SITE_OTHER): Payer: Self-pay | Admitting: Surgery

## 2012-02-25 ENCOUNTER — Other Ambulatory Visit (INDEPENDENT_AMBULATORY_CARE_PROVIDER_SITE_OTHER): Payer: Self-pay

## 2012-02-25 VITALS — BP 140/80 | HR 78 | Temp 97.8°F | Resp 14 | Ht 63.0 in | Wt 189.0 lb

## 2012-02-25 DIAGNOSIS — Z9889 Other specified postprocedural states: Secondary | ICD-10-CM

## 2012-02-25 DIAGNOSIS — D0510 Intraductal carcinoma in situ of unspecified breast: Secondary | ICD-10-CM

## 2012-02-25 NOTE — Patient Instructions (Signed)
Refer to radiation oncology in [place].  Refer to medical oncology in [place]. Return 6 weeks.  If redness increases or drainage noted please return to clinic sooner.

## 2012-02-25 NOTE — Progress Notes (Signed)
Patient returns after right breast lumpectomy for grade 2 DCIS. Margins were all greater than 1 cm. She is ER and PR positive. She is doing well.  Exam: Right breast lumpectomy incision healing well. Minimal erythema involving the superior skin which appears to be a reaction to the Dermabond.   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

## 2012-03-14 ENCOUNTER — Encounter (HOSPITAL_COMMUNITY): Payer: Medicare Other | Attending: Oncology

## 2012-03-14 ENCOUNTER — Encounter (HOSPITAL_COMMUNITY): Payer: Self-pay

## 2012-03-14 VITALS — BP 141/83 | HR 69 | Temp 96.8°F | Ht 62.0 in | Wt 187.7 lb

## 2012-03-14 DIAGNOSIS — D0590 Unspecified type of carcinoma in situ of unspecified breast: Secondary | ICD-10-CM

## 2012-03-14 DIAGNOSIS — D0510 Intraductal carcinoma in situ of unspecified breast: Secondary | ICD-10-CM

## 2012-03-14 DIAGNOSIS — Z170 Estrogen receptor positive status [ER+]: Secondary | ICD-10-CM

## 2012-03-14 NOTE — Patient Instructions (Signed)
Paula [name]  [id number] 1945/10/26 Dr. Glenford Peers   [facility]  Discharge Instructions  RECOMMENDATIONS MADE BY THE CONSULTANT AND ANY TEST RESULTS WILL BE SENT TO YOUR REFERRING DOCTOR.   EXAM FINDINGS BY MD TODAY AND SIGNS AND SYMPTOMS TO REPORT TO CLINIC OR PRIMARY MD: exam and discussion per MD.  We will get you on Tamoxifen after radiation.  MEDICATIONS PRESCRIBED: none   INSTRUCTIONS GIVEN AND DISCUSSED: Other :  Report any new lumps, bone pain or shortness of breath.  SPECIAL INSTRUCTIONS/FOLLOW-UP: Return to Clinic in 2 months to see Dr. Lytle [place].   I acknowledge that I have been informed and understand all the instructions given to me and received a copy. I do not have any more questions at this time, but understand that I may call the Specialty Clinic at [facility] at [phone] during business hours should I have any further questions or need assistance in obtaining follow-up care.    __________________________________________  _____________  __________ Signature of Patient or Authorized Representative            Date                   Time    __________________________________________ Nurse's Signature

## 2012-03-14 NOTE — Progress Notes (Signed)
Referral MD  Dr Warren Lacy Dr C Heist Dr Liliane Shi   Reason for Referral: DCIS  No chief complaint on file. :Abnormal Mammogram Pleasant 66 yo woman from [place], [place] who presents with an abnormal mammogram on 01/14/12. A subsequent biopsy showed this to be DCIS, ER 100%, PR94%. MRI performed 01/11/2012 revealed a hematoma cavity, small hepatic cysts and a trace pleural effusion. She underwent surgery on 02/08/12 which showed a 2.5 biopsy cavity and scattered foci of DCIS. She has had an unremarkable post-op course and is due to start radiation with dr Fransico Meadow in [place] this week.    Past Medical History  Diagnosis Date  . Hypertension   . Hyperlipidemia   . Gilbert's syndrome   . Conjunctival disorder, other   . Bronchitis   . Shortness of breath   . Arthritis   . GERD (gastroesophageal reflux disease)   . Breast cancer     right side DCIS  :  Past Surgical History  Procedure Date  . Tonsillectomy   . Breast biopsy     right breast  . Breast lumpectomy 1982    right side  :  Current outpatient prescriptions:albuterol (PROVENTIL HFA;VENTOLIN HFA) 108 (90 BASE) MCG/ACT inhaler, Inhale 2 puffs into the lungs every 6 (six) hours as needed., Disp: , Rfl: ;  beclomethasone (QVAR) 40 MCG/ACT inhaler, Inhale 1 puff into the lungs 2 (two) times daily. , Disp: , Rfl: ;  CALCIUM PO, Take by mouth., Disp: , Rfl: ;  Fexofenadine HCl (ALLEGRA PO), Take by mouth daily., Disp: , Rfl:  fish oil-omega-3 fatty acids 1000 MG capsule, Take 2 g by mouth daily., Disp: , Rfl: ;  losartan-hydrochlorothiazide (HYZAAR) 100-25 MG per tablet, Take 1 tablet by mouth daily., Disp: , Rfl: ;  Misc Natural Products (PROGESTERONE EX), Apply topically as needed. Use 2 weeks each month., Disp: , Rfl: ;  mometasone (NASONEX) 50 MCG/ACT nasal spray, Place 2 sprays into the nose daily., Disp: , Rfl:  montelukast (SINGULAIR) 10 MG tablet, Take 10 mg by mouth at bedtime., Disp: , Rfl: ;  Multiple Vitamin (MULTIVITAMIN)  capsule, Take 1 capsule by mouth daily., Disp: , Rfl: ;  Multiple Vitamins-Iron (CHLORELLA PO), Take by mouth., Disp: , Rfl: ;  simvastatin (ZOCOR) 10 MG tablet, Take 10 mg by mouth at bedtime., Disp: , Rfl: ;  VITAMIN E PO, Take by mouth daily., Disp: , Rfl: ;  zinc gluconate 50 MG tablet, Take 50 mg by mouth daily., Disp: , Rfl: :    :  Allergies  Allergen Reactions  . Amoxicillin Rash  . Ceclor (Cefaclor) Rash  . Doxycycline Rash  :  Family History  Problem Relation Age of Onset  . Heart disease Father   . Heart disease Sister   . Heart disease Brother   . Cancer Maternal Aunt     unaware  . Heart disease Maternal Uncle   :  History   Social History  . Marital Status: Divorced x2; 1st marriage x 10y; 2nd marriage x 30 y.    Spouse Name: N/A    Number of Children: 0  . Years of Education: N/A   Occupational History  . Not on file. 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   Social History Main Topics  . Smoking status: Former Smoker x 20y    Quit date: 02/03/1991  . Smokeless tobacco: Not on file  . Alcohol Use: No  . Drug Use: No  . Sexually Active:    Other Topics Concern  .  Not on file   Social History Narrative  . No narrative on file  :parents- deceased old age; 2 sisters- A+W; 1 Brother A+W; 1 brother -suicide No history of breast or ovarian cancer in family  Reproductive History G0P0 Menarche-11 Menopause -61's; has been using a black cohosh cream topically for many years  A comprehensive review of systems was negative.  Exam: ECOG 0 General appearance: alert, cooperative and appears stated age Eyes: conjunctivae/corneas clear. PERRL, EOM's intact. Fundi benign. Throat: lips, mucosa, and tongue normal; teeth and gums normal Back: symmetric, no curvature. ROM normal. No CVA tenderness. Resp: clear to auscultation bilaterally and normal percussion bilaterally Breasts: normal appearance, no masses or tenderness Cardio: regular rate and  rhythm, S1, S2 normal, no murmur, click, rub or gallop and normal apical impulse GI: soft, non-tender; bowel sounds normal; no masses,  no organomegaly Extremities: extremities normal, atraumatic, no cyanosis or edema Neurologic: Grossly normal    Blood smear review: n/a  Pathology:as above  No results found.  Assessment and Plan:  Pleasant post menopausal woman who presents with DCIS s/p lumpectomy. We discussed the rationale for adjuvant tamoxifen therapy as well as stopping her black cohosh cream. She is concerned about side effects, and we discussed that as well. Given her history of fibrocystic disease she may have a higher risk to develop both non-invasive and invasive breast cancer.   I have scheduled her to see Dr Mariel Sleet in 2 months to discuss this further.   Pierce Crane MD

## 2012-03-16 ENCOUNTER — Ambulatory Visit (INDEPENDENT_AMBULATORY_CARE_PROVIDER_SITE_OTHER): Payer: Medicare Other

## 2012-04-05 ENCOUNTER — Ambulatory Visit (INDEPENDENT_AMBULATORY_CARE_PROVIDER_SITE_OTHER): Payer: Medicare Other | Admitting: Surgery

## 2012-04-05 ENCOUNTER — Encounter (INDEPENDENT_AMBULATORY_CARE_PROVIDER_SITE_OTHER): Payer: Self-pay | Admitting: Surgery

## 2012-04-05 VITALS — BP 128/82 | HR 71 | Temp 97.4°F | Resp 16 | Ht 63.0 in | Wt 187.4 lb

## 2012-04-05 DIAGNOSIS — Z9889 Other specified postprocedural states: Secondary | ICD-10-CM

## 2012-04-05 NOTE — Patient Instructions (Addendum)
Return 6 months

## 2012-04-05 NOTE — Progress Notes (Signed)
Patient returns after right breast lumpectomy for grade 2 DCIS. Margins were all greater than 1 cm. She is ER and PR positive. She is doing well.Tolerating radiation therapy.  Exam: Right breast lumpectomy incision healing well.   Impression: Right breast DCIS intermediate grade ER PR positive with negative margins  Plan: Refer to medical and radiation pathology  Return 6 months

## 2012-05-09 ENCOUNTER — Encounter (HOSPITAL_COMMUNITY): Payer: Self-pay | Admitting: Oncology

## 2012-05-09 ENCOUNTER — Encounter (HOSPITAL_COMMUNITY): Payer: Medicare Other | Attending: Oncology | Admitting: Oncology

## 2012-05-09 VITALS — BP 129/76 | HR 67 | Temp 97.9°F | Wt 187.8 lb

## 2012-05-09 DIAGNOSIS — D0590 Unspecified type of carcinoma in situ of unspecified breast: Secondary | ICD-10-CM | POA: Insufficient documentation

## 2012-05-09 DIAGNOSIS — D0510 Intraductal carcinoma in situ of unspecified breast: Secondary | ICD-10-CM

## 2012-05-09 DIAGNOSIS — Z170 Estrogen receptor positive status [ER+]: Secondary | ICD-10-CM

## 2012-05-09 MED ORDER — TAMOXIFEN CITRATE 20 MG PO TABS: 20.0000 mg | ORAL_TABLET | Freq: Every day | ORAL | Status: DC

## 2012-05-09 NOTE — Patient Instructions (Addendum)
Paula [name]  [id number] 10-13-1945 Dr. Glenford Peers  [facility]  Discharge Instructions  RECOMMENDATIONS MADE BY THE CONSULTANT AND ANY TEST RESULTS WILL BE SENT TO YOUR REFERRING DOCTOR.   EXAM FINDINGS BY MD TODAY AND SIGNS AND SYMPTOMS TO REPORT TO CLINIC OR PRIMARY MD: exam and discussion by Dr. Mariel Sleet.  Ask the mammographer when you get your next mammogram how dense your breast are.  Also, ask them to send [place] a copy of the report.  MEDICATIONS PRESCRIBED: Tamoxifen 20 mg daily  Call us if and when you start the medication. Tobie Lords, RN [phone]) Follow label directions  INSTRUCTIONS GIVEN AND DISCUSSED: Other :  Report any new lumps, bone pain or shortness of breath or any problems with the Tamoxifen if you take it.  SPECIAL INSTRUCTIONS/FOLLOW-UP: Return to Clinic in 6 months.   I acknowledge that I have been informed and understand all the instructions given to me and received a copy. I do not have any more questions at this time, but understand that I may call the Specialty Clinic at [facility] at [phone] during business hours should I have any further questions or need assistance in obtaining follow-up care.    __________________________________________  _____________  __________ Signature of Patient or Authorized Representative            Date                   Time    __________________________________________ Nurse's Signature

## 2012-05-09 NOTE — Progress Notes (Signed)
Problem #1 DCIS of the right breast intermediate grade 2.5 cm in size with great margins estrogen receptor positive and progesterone receptor +100% and 94% respectively. She is status post lumpectomy followed by radiation therapy which finished one week ago. She is here to discuss adjuvant tamoxifen.  She had met with Dr. Donnie Coffin in my absence who discussed with her adjuvant tamoxifen. We went over in detail once again the pros and cons of taking the drug. We went into detail about the side effects. She still has her uterus. She does have varicose veins but never had a blood clot. She has excellent bones she states. She does have a family history of heart disease in relatives who did not smoke. She is also nonsmoker. 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 We talked about appetite stimulation fluid retention weight gain hot flashes, blood clots both venous and arterial, and uterine cancer. We talked about skin rashes. She also knows GI upset can occur  Physical exam vital signs are recorded. She is a pleasant woman in no acute distress alert and oriented. She has no lymphadenopathy. The right breast remains somewhat red slightly erythematous and tender minimally. There are no masses. She has clear lung fields. Heart shows a regular rhythm and rate without murmur rub or gallop. I think her lungs do show mildly diminished breath sounds. Abdomen is obese nontender without hepatosplenomegaly. She has no peripheral edema but she has minimal varicose veins on the right leg in particular. Pulses in her feet are intact. The left breast is negative. The right breast has no masses.  After a thorough discussion lasting approximately 30 minutes she was given a prescription for tamoxifen. She wants to think about things however before starting the drug. If she decides against the drug she will call us. Either way we will see her in 6 months and if all is well we will see her in one year. She will call if she has  any questions. She knows a predominant benefit will be in prevention of disease that would be estrogen receptor positive in the opposite breast. She understands fully that DCIS by itself is not a lethal disease.

## 2012-06-03 ENCOUNTER — Other Ambulatory Visit (HOSPITAL_COMMUNITY): Payer: Self-pay | Admitting: *Deleted

## 2012-06-03 DIAGNOSIS — D0510 Intraductal carcinoma in situ of unspecified breast: Secondary | ICD-10-CM

## 2012-06-06 ENCOUNTER — Other Ambulatory Visit (HOSPITAL_COMMUNITY): Payer: Self-pay

## 2012-06-06 DIAGNOSIS — D0510 Intraductal carcinoma in situ of unspecified breast: Secondary | ICD-10-CM

## 2012-06-06 MED ORDER — TAMOXIFEN CITRATE 20 MG PO TABS: 20.0000 mg | ORAL_TABLET | Freq: Every day | ORAL | Status: DC

## 2012-06-08 ENCOUNTER — Other Ambulatory Visit (HOSPITAL_COMMUNITY): Payer: Medicare Other

## 2012-08-15 ENCOUNTER — Encounter (INDEPENDENT_AMBULATORY_CARE_PROVIDER_SITE_OTHER): Payer: Self-pay | Admitting: Surgery

## 2012-08-15 ENCOUNTER — Ambulatory Visit (INDEPENDENT_AMBULATORY_CARE_PROVIDER_SITE_OTHER): Payer: Medicare Other | Admitting: Surgery

## 2012-08-15 VITALS — BP 122/64 | HR 68 | Temp 97.1°F | Ht 62.0 in | Wt 188.8 lb

## 2012-08-15 DIAGNOSIS — Z853 Personal history of malignant neoplasm of breast: Secondary | ICD-10-CM

## 2012-08-15 NOTE — Patient Instructions (Signed)
Return 6 months.  Mammogram next year.

## 2012-08-15 NOTE — Progress Notes (Signed)
NAME: Paula [name]       DOB: 1945/12/09           DATE: 08/15/2012       MRN: [id number]   Paula [name] is a 66 y.o.Marland Kitchenfemale who presents for routine followup of her DCIS right diagnosed in 12/2011 and treated with partial mastectomy. She has no problems or concerns on either side.  PFSH: She has had no significant changes since the last visit here.  ROS: There have been no significant changes since the last visit here  EXAM:  VS: BP 122/64  Pulse 68  Temp 97.1 F (36.2 C) (Temporal)  Ht 5\' 2"  (1.575 m)  Wt 188 lb 12.8 oz (85.639 kg)  BMI 34.53 kg/m2  SpO2 98%  General: The patient is alert, oriented, generally healthy appearing, NAD. Mood and affect are normal.  Breasts:  right smaller than left no masses bilaterally.  Incision well healed  Lymphatics: She has no axillary or supraclavicular adenopathy on either side.  Extremities: Full ROM of the surgical side with no lymphedema noted.  Data Reviewed: Oncology note  Impression: Doing well, with no evidence of recurrent cancer or new cancer  Plan: Will continue to follow up on an bi annual basis here.Mammogram next spring.

## 2012-08-22 ENCOUNTER — Telehealth (HOSPITAL_COMMUNITY): Payer: Self-pay

## 2012-08-22 NOTE — Telephone Encounter (Signed)
In regards to her osteopenia, patient states that she is suppose to be taking Caltrate 600 mg with D 400 IU daily and will make concentrated effort to do this daily ( has not been consistent).  Is also taking vitamin D 50,000 units 1x week.

## 2012-10-24 ENCOUNTER — Encounter (HOSPITAL_COMMUNITY): Payer: Medicare Other | Attending: Oncology | Admitting: Oncology

## 2012-10-24 ENCOUNTER — Encounter (HOSPITAL_COMMUNITY): Payer: Self-pay | Admitting: Oncology

## 2012-10-24 VITALS — BP 128/56 | HR 67 | Temp 97.8°F | Resp 18 | Wt 188.2 lb

## 2012-10-24 DIAGNOSIS — Z09 Encounter for follow-up examination after completed treatment for conditions other than malignant neoplasm: Secondary | ICD-10-CM | POA: Insufficient documentation

## 2012-10-24 DIAGNOSIS — D0590 Unspecified type of carcinoma in situ of unspecified breast: Secondary | ICD-10-CM

## 2012-10-24 DIAGNOSIS — E669 Obesity, unspecified: Secondary | ICD-10-CM | POA: Insufficient documentation

## 2012-10-24 DIAGNOSIS — R160 Hepatomegaly, not elsewhere classified: Secondary | ICD-10-CM | POA: Insufficient documentation

## 2012-10-24 DIAGNOSIS — Z853 Personal history of malignant neoplasm of breast: Secondary | ICD-10-CM | POA: Insufficient documentation

## 2012-10-24 LAB — COMPREHENSIVE METABOLIC PANEL
Albumin: 3.6 g/dL (ref 3.5–5.2)
Alkaline Phosphatase: 52 U/L (ref 39–117)
BUN: 15 mg/dL (ref 6–23)
Chloride: 103 mEq/L (ref 96–112)
Creatinine, Ser: 0.71 mg/dL (ref 0.50–1.10)
GFR calc Af Amer: >90 mL/min (ref >90)
GFR calc non Af Amer: 88 mL/min — ABNORMAL LOW (ref >90)
Glucose, Bld: 85 mg/dL (ref 70–99)
Potassium: 3.6 mEq/L (ref 3.5–5.1)
Total Bilirubin: 0.8 mg/dL (ref 0.3–1.2)

## 2012-10-24 LAB — CBC WITH DIFFERENTIAL/PLATELET
Basophils Absolute: 0 10*3/uL (ref 0.0–0.1)
Eosinophils Relative: 2 % (ref 0–5)
Lymphocytes Relative: 20 % (ref 12–46)
Lymphs Abs: 1.1 10*3/uL (ref 0.7–4.0)
MCV: 92.7 fL (ref 78.0–100.0)
Neutro Abs: 3.7 10*3/uL (ref 1.7–7.7)
Neutrophils Relative %: 66 % (ref 43–77)
Platelets: 244 10*3/uL (ref 150–400)
RBC: 4.12 MIL/uL (ref 3.87–5.11)
RDW: 12.8 % (ref 11.5–15.5)
WBC: 5.5 10*3/uL (ref 4.0–10.5)

## 2012-10-24 NOTE — Patient Instructions (Addendum)
[facility] Discharge Instructions  RECOMMENDATIONS MADE BY THE CONSULTANT AND ANY TEST RESULTS WILL BE SENT TO YOUR REFERRING PHYSICIAN.  EXAM FINDINGS BY THE PHYSICIAN TODAY AND SIGNS OR SYMPTOMS TO REPORT TO CLINIC OR PRIMARY PHYSICIAN: Exam and discussion by MD.  He could feel your liver upon exam and need to check it out by doing some blood work and scans.  We will let you know if there's anything we need to do based upon your test results.  MEDICATIONS PRESCRIBED:  none  INSTRUCTIONS GIVEN AND DISCUSSED: Report any new lumps, bone pain or shortness of breath.  SPECIAL INSTRUCTIONS/FOLLOW-UP: Blood work today, scans next week and to see MD in 6 months.  Thank you for choosing [facility] to provide your oncology and hematology care.  To afford each patient quality time with our providers, please arrive at least 15 minutes before your scheduled appointment time.  With your help, our goal is to use those 15 minutes to complete the necessary work-up to ensure our physicians have the information they need to help with your evaluation and healthcare recommendations.    Effective January 1st, 2014, we ask that you re-schedule your appointment with our physicians should you arrive 10 or more minutes late for your appointment.  We strive to give you quality time with our providers, and arriving late affects you and other patients whose appointments are after yours.    Again, thank you for choosing [facility].  Our hope is that these requests will decrease the amount of time that you wait before being seen by our physicians.       _____________________________________________________________  Should you have questions after your visit to [facility], please contact our office at [phone] between the hours of 8:30 a.m. and 5:00 p.m.  Voicemails left after 4:30 p.m. will not be returned until the following business day.  For  prescription refill requests, have your pharmacy contact our office with your prescription refill request.
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
## 2012-11-04 LAB — MISCELLANEOUS TEST: Miscellaneous Test: 84660

## 2012-11-07 ENCOUNTER — Encounter (HOSPITAL_COMMUNITY): Payer: Self-pay

## 2012-11-07 ENCOUNTER — Ambulatory Visit (HOSPITAL_COMMUNITY)
Admission: RE | Admit: 2012-11-07 | Discharge: 2012-11-07 | Disposition: A | Discharge status: Home / Self Care | Payer: Medicare Other | Source: Ambulatory Visit | Attending: Oncology | Admitting: Oncology

## 2012-11-07 DIAGNOSIS — R935 Abnormal findings on diagnostic imaging of other abdominal regions, including retroperitoneum: Secondary | ICD-10-CM | POA: Insufficient documentation

## 2012-11-07 DIAGNOSIS — C50919 Malignant neoplasm of unspecified site of unspecified female breast: Secondary | ICD-10-CM | POA: Insufficient documentation

## 2012-11-07 DIAGNOSIS — R911 Solitary pulmonary nodule: Secondary | ICD-10-CM | POA: Insufficient documentation

## 2012-11-07 DIAGNOSIS — R160 Hepatomegaly, not elsewhere classified: Secondary | ICD-10-CM

## 2012-11-07 MED ORDER — IOHEXOL 300 MG/ML  SOLN
100.0000 mL | Freq: Once | INTRAMUSCULAR | Status: AC | PRN
  Administered 2012-11-07: 100 mL via INTRAVENOUS

## 2012-11-09 ENCOUNTER — Other Ambulatory Visit (HOSPITAL_COMMUNITY): Payer: Self-pay | Admitting: Oncology

## 2012-11-09 NOTE — Progress Notes (Signed)
Because of the patient's history of dark urine intermittently especially with stressful situations, I did her PNH test which was negative. I could also feel her liver edge so a CAT scan was performed which did not reveal interestingly liver enlargement let alone splenic enlargement. I suspect her shortened rib cage due to her stature allowed me to feel her liver edge. What was found was a 5 cm lesion consistent thus far with a hemangioma. After discussing this case with Dr. Kearney Hard in radiology we have opted to repeat the CAT scan in 6 months. The patient is very agreeable to this plan and would also rather not do an MRI scan.  We will therefore do a scan in 6 months and see her right after that for followup of her DCIS. I did speak with the patient this afternoon.

## 2012-11-15 ENCOUNTER — Telehealth (HOSPITAL_COMMUNITY): Payer: Self-pay

## 2012-11-15 NOTE — Telephone Encounter (Signed)
Message copied by Evelena Leyden on Tue Nov 15, 2012  4:58 PM ------      Message from: Mariel Sleet, ERIC S      Created: Tue Nov 15, 2012  4:11 PM       By themselves they typically do not pose a health hazard but are typically an incidental finding

## 2012-11-15 NOTE — Telephone Encounter (Signed)
Patient notified that per Dr. Mariel Sleet that the hemangioma typically do not pose a health hazard but are typically and incidental finding.  Verbalized understanding.

## 2012-11-15 NOTE — Telephone Encounter (Signed)
Patient wants to know if the hemangioma of her liver is potentially dangerous and is there anything she needs to do to prevent problems with it?

## 2013-02-13 ENCOUNTER — Encounter (INDEPENDENT_AMBULATORY_CARE_PROVIDER_SITE_OTHER): Payer: Self-pay | Admitting: Surgery

## 2013-02-13 ENCOUNTER — Ambulatory Visit (INDEPENDENT_AMBULATORY_CARE_PROVIDER_SITE_OTHER): Payer: Medicare Other | Admitting: Surgery

## 2013-02-13 VITALS — BP 130/72 | HR 76 | Temp 96.7°F | Ht 63.25 in | Wt 188.8 lb

## 2013-02-13 DIAGNOSIS — Z853 Personal history of malignant neoplasm of breast: Secondary | ICD-10-CM | POA: Insufficient documentation

## 2013-02-13 NOTE — Progress Notes (Signed)
NAME: [name]       DOB: 1945-12-28           DATE: 02/13/2013       MRN: [id number]   Paula [name] is a 67 y.o.Marland Kitchenfemale who presents for routine followup of her DCIS right diagnosed in 12/2011 and treated with partial mastectomy. She has no problems or concerns on either side.  PFSH: She has had no significant changes since the last visit here.  ROS: There have been no significant changes since the last visit here  EXAM:  VS: BP 130/72  Pulse 76  Temp(Src) 96.7 F (35.9 C) (Temporal)  Ht 5' 3.25" (1.607 m)  Wt 188 lb 12.8 oz (85.639 kg)  BMI 33.16 kg/m2  SpO2 98%  General: The patient is alert, oriented, generally healthy appearing, NAD. Mood and affect are normal.  Breasts:  right smaller than left no masses bilaterally.  Incision well healed  Lymphatics: She has no axillary or supraclavicular adenopathy on either side.  Extremities: Full ROM of the surgical side with no lymphedema noted.  Data Reviewed: Oncology note  Impression: Doing well, with no evidence of recurrent cancer or new cancer  Plan: Will continue to follow up on an bi annual basis here.Mammogram next spring.

## 2013-02-13 NOTE — Patient Instructions (Signed)
Return 6 months

## 2013-04-24 ENCOUNTER — Ambulatory Visit (HOSPITAL_COMMUNITY): Payer: Medicare Other | Admitting: Oncology

## 2013-05-01 ENCOUNTER — Encounter (HOSPITAL_COMMUNITY): Payer: Self-pay

## 2013-05-01 ENCOUNTER — Encounter (HOSPITAL_COMMUNITY): Payer: Medicare Other | Attending: Hematology and Oncology

## 2013-05-01 VITALS — BP 109/66 | HR 60 | Temp 97.7°F | Resp 16 | Wt 182.6 lb

## 2013-05-01 DIAGNOSIS — R160 Hepatomegaly, not elsewhere classified: Secondary | ICD-10-CM

## 2013-05-01 DIAGNOSIS — K769 Liver disease, unspecified: Secondary | ICD-10-CM

## 2013-05-01 DIAGNOSIS — D0511 Intraductal carcinoma in situ of right breast: Secondary | ICD-10-CM

## 2013-05-01 DIAGNOSIS — D0590 Unspecified type of carcinoma in situ of unspecified breast: Secondary | ICD-10-CM

## 2013-05-01 DIAGNOSIS — Z853 Personal history of malignant neoplasm of breast: Secondary | ICD-10-CM | POA: Insufficient documentation

## 2013-05-01 DIAGNOSIS — Z09 Encounter for follow-up examination after completed treatment for conditions other than malignant neoplasm: Secondary | ICD-10-CM | POA: Insufficient documentation

## 2013-05-01 LAB — CREATININE, SERUM
Creatinine, Ser: 0.85 mg/dL (ref 0.50–1.10)
GFR calc non Af Amer: 70 mL/min — ABNORMAL LOW (ref >90)

## 2013-05-01 NOTE — Patient Instructions (Addendum)
[facility] Discharge Instructions  RECOMMENDATIONS MADE BY THE CONSULTANT AND ANY TEST RESULTS WILL BE SENT TO YOUR REFERRING PHYSICIAN.  EXAM FINDINGS BY THE PHYSICIAN TODAY AND SIGNS OR SYMPTOMS TO REPORT TO CLINIC OR PRIMARY PHYSICIAN: exam and discussion by Dr. Sharia Reeve.  No evidence of recurrence by exam.  Need to get the results of the Mammogram that you had done in June.  Need to do a CT scan of your abdomen to make sure there have been no changes since your last scan.  MEDICATIONS PRESCRIBED:  Continue Tamoxifen  INSTRUCTIONS GIVEN AND DISCUSSED: Report any new lumps, bone pain, shortness of breath or other symptoms.  SPECIAL INSTRUCTIONS/FOLLOW-UP: CT scan of your abdomen and follow-up in 6 months.  Thank you for choosing [facility] to provide your oncology and hematology care.  To afford each patient quality time with our providers, please arrive at least 15 minutes before your scheduled appointment time.  With your help, our goal is to use those 15 minutes to complete the necessary work-up to ensure our physicians have the information they need to help with your evaluation and healthcare recommendations.    Effective January 1st, 2014, we ask that you re-schedule your appointment with our physicians should you arrive 10 or more minutes late for your appointment.  We strive to give you quality time with our providers, and arriving late affects you and other patients whose appointments are after yours.    Again, thank you for choosing [facility].  Our hope is that these requests will decrease the amount of time that you wait before being seen by our physicians.       _____________________________________________________________  Should you have questions after your visit to [facility], please contact our office at [phone] between the hours of 8:30 a.m. and 5:00 p.m.  Voicemails left after 4:30 p.m. will not be  returned until the following business day.  For prescription refill requests, have your pharmacy contact our office with your prescription refill request.

## 2013-05-01 NOTE — Progress Notes (Signed)
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 Estrogen receptors +100%, progesterone receptors +94%. She had a lumpectomy followed by radiation therapy which finished in July 2013. She started tamoxifen after our discussion in 05/09/2012.   INTERVAL HISTORY:   Paula [name] 67 y.o. female returns to the clinic today for scheduled follow up. She had an abdominal CT in 10/2012 which showed 5 cm enhancing lesion in the anterior right liver which is believed to be a  benign cavernous hemangioma and repeat was planned in 6 months.  Patient reportedly had a recent mammogram in an outside health system in 03/2013 which was reportedly normal. The official report not available at this time. She is tolerating tamoxifen well without any significant side effects. She admits to mild aches and pain which was preexisting prior to tamoxifen and has not changed. She does report dry skin that she thinks is from Tamoxifen.Denies hot flashes. Reports 'soreness' in her right arm pit since breast surgery.She denies vaginal bleeding and states that she has had a recent gyn exam even though records are not available at the time of this note. She denies any new breast lumps, denies shortness of breath or bone pain.  Shows a denies any unintended weight loss.   MEDICAL HISTORY: Past Medical History  Diagnosis Date  . Hypertension   . Hyperlipidemia   . Gilbert's syndrome   . Conjunctival disorder, other   . Bronchitis   . Shortness of breath   . Arthritis   . GERD (gastroesophageal reflux disease)   . Breast cancer     right side DCIS  . Radiation     completed after 33 treatments  . Vitamin D deficiency   . Irregular heart beat     ALLERGIES:   is allergic to amoxicillin; ceclor; and doxycycline.  MEDICATIONS:  Current Outpatient Prescriptions  Medication Sig Dispense Refill  . albuterol (PROVENTIL HFA;VENTOLIN HFA) 108 (90 BASE) MCG/ACT inhaler Inhale 2 puffs into the lungs 2 (two) times daily.       . Ascorbic Acid (VITAMIN C) 1000 MG tablet Take 1,000 mg by mouth daily.      . beclomethasone (QVAR) 40 MCG/ACT inhaler Inhale 1 puff into the lungs 2 (two) times daily.       . Cholecalciferol (VITAMIN D3) 1000 UNITS CAPS Take 1,000 Units by mouth daily.      Marland Kitchen Fexofenadine HCl (ALLEGRA PO) Take 140 mg by mouth daily.       . fish oil-omega-3 fatty acids 1000 MG capsule Take 2 g by mouth daily.      . folic acid (FOLVITE) 800 MCG tablet Take 400 mcg by mouth daily.      . furosemide (LASIX) 40 MG tablet Take 40 mg by mouth. Takes about 4 times weekly      . Ibuprofen (ADVIL PO) Take 200 mg by mouth. Taking 2 - 3 daily      . losartan-hydrochlorothiazide (HYZAAR) 100-25 MG per tablet Take 1 tablet by mouth daily.      . mometasone (NASONEX) 50 MCG/ACT nasal spray Place 2 sprays into the nose daily.      . montelukast (SINGULAIR) 10 MG tablet Take 10 mg by mouth at bedtime.      Marland Kitchen  Multiple Vitamin (MULTIVITAMIN) capsule Take 1 capsule by mouth daily.      . Multiple Vitamins-Iron (CHLORELLA PO) Take by mouth.      . multivitamin-lutein (OCUVITE-LUTEIN) CAPS Take 1 capsule by mouth daily.      . Phenazopyridine HCl (AZO-STANDARD PO) Take 1 tablet by mouth 3 (three) times a week.      . ranitidine (ZANTAC) 150 MG tablet Take 150 mg by mouth daily.      . salicylic acid 6 % gel Apply 1 application topically as needed.      . simvastatin (ZOCOR) 10 MG tablet Take 10 mg by mouth at bedtime.      . tamoxifen (NOLVADEX) 20 MG tablet Take 20 mg by mouth daily.      . vitamin B-12 (CYANOCOBALAMIN) 1000 MCG tablet Take 1,000 mcg by mouth daily.      . Vitamin D, Ergocalciferol, (DRISDOL) 50000 UNITS CAPS Take 50,000 Units by mouth every 7  (seven) days.      Marland Kitchen VITAMIN E PO Take 400 Units by mouth daily.       Marland Kitchen zinc gluconate 50 MG tablet Take 50 mg by mouth daily.      Marland Kitchen CALCIUM PO Take 600 mg by mouth daily.       . clobetasol cream (TEMOVATE) 0.05 % Apply 1 application topically as needed.       No current facility-administered medications for this visit.    SURGICAL HISTORY:  Past Surgical History  Procedure Laterality Date  . Tonsillectomy    . Breast biopsy      right breast  . Breast lumpectomy  1982    right side     REVIEW OF SYSTEMS: As above otherwise negative.   PHYSICAL EXAMINATION:  Blood pressure 109/66, pulse 60, temperature 97.7 F (36.5 C), temperature source Oral, resp. rate 16, weight 182 lb 9.6 oz (82.827 kg). GENERAL: No distress. SKIN:  No rashes or significant lesions  HEAD: Normocephalic, No masses, lesions, tenderness or abnormalities  EYES: Conjunctiva are pink and non-injected  ENT: External ears normal ,lips, buccal mucosa, and tongue normal and mucous membranes are moist  LYMPH: No palpable lymphadenopathy, in the neck, supraclavicular areas or axilla. BREAST:No masses palpable on both breast. Right axilla ,no mass or tenderness. LUNGS: clear to auscultation , no crackles or wheezes HEART: regular rate & rhythm, no murmurs, no gallops, S1 normal and S2 normal  ABDOMEN: Abdomen soft, non-tender, normal bowel sounds, no masses or organomegaly and no hepatosplenomegaly palpable EXTREMITIES: No edema, no skin discoloration or tenderness NEURO: alert & oriented , no focal motor/sensory deficits.     LABORATORY DATA: Lab Results  Component Value Date   WBC 5.5 10/24/2012   HGB 12.8 10/24/2012   HCT 38.2 10/24/2012   MCV 92.7 10/24/2012   PLT 244 10/24/2012      Chemistry      Component Value Date/Time   NA 141 10/24/2012 1247   K 3.6 10/24/2012 1247   CL 103 10/24/2012 1247   CO2 29 10/24/2012 1247   BUN 15 10/24/2012 1247   CREATININE 0.71 10/24/2012 1247      Component Value  Date/Time   CALCIUM 9.3 10/24/2012 1247   ALKPHOS 52 10/24/2012 1247   AST 21 10/24/2012 1247   ALT 18 10/24/2012 1247   BILITOT 0.8 10/24/2012 1247       RADIOGRAPHIC STUDIES: No results found.   ASSESSMENT:  1.Patient has no evidence of disease at this time as regards her  breast cancer. 2.Liver mass lesion; suspected to be benign. Will repeat CT, if unchanged, the clinical follow up only  may be warranted  PLAN:  1. CT abdomen ordered. 2. Continue Tamoxifen for total of 5 years. 3. Yearly Gyn exam. 4. Right axilla unremarkable, patient reassured  5. Return to clinic in 6 months.    All questions were satisfactorily answered. Patient knows to call if  any concern arises.  I spent more than 50 % counseling the patient face to face. The total time spent in the appointment was 30 minutes.  Sherral Hammers, MD FACP. Hematology/Oncology.

## 2013-05-05 ENCOUNTER — Other Ambulatory Visit (HOSPITAL_COMMUNITY): Payer: Self-pay | Admitting: Oncology

## 2013-05-08 ENCOUNTER — Ambulatory Visit (HOSPITAL_COMMUNITY)
Admission: RE | Admit: 2013-05-08 | Discharge: 2013-05-08 | Disposition: A | Discharge status: Home / Self Care | Payer: Medicare Other | Source: Ambulatory Visit | Attending: Hematology and Oncology | Admitting: Hematology and Oncology

## 2013-05-08 ENCOUNTER — Other Ambulatory Visit (HOSPITAL_COMMUNITY): Payer: Medicare Other

## 2013-05-08 DIAGNOSIS — R160 Hepatomegaly, not elsewhere classified: Secondary | ICD-10-CM

## 2013-05-08 DIAGNOSIS — K7689 Other specified diseases of liver: Secondary | ICD-10-CM | POA: Insufficient documentation

## 2013-05-08 MED ORDER — IOHEXOL 300 MG/ML  SOLN
100.0000 mL | Freq: Once | INTRAMUSCULAR | Status: AC | PRN
  Administered 2013-05-08: 100 mL via INTRAVENOUS

## 2013-07-31 ENCOUNTER — Encounter (INDEPENDENT_AMBULATORY_CARE_PROVIDER_SITE_OTHER): Payer: Self-pay

## 2013-09-04 ENCOUNTER — Ambulatory Visit (INDEPENDENT_AMBULATORY_CARE_PROVIDER_SITE_OTHER): Payer: Medicare Other | Admitting: Surgery

## 2013-09-11 ENCOUNTER — Ambulatory Visit (INDEPENDENT_AMBULATORY_CARE_PROVIDER_SITE_OTHER): Payer: Medicare Other | Admitting: Surgery

## 2013-09-11 ENCOUNTER — Encounter (INDEPENDENT_AMBULATORY_CARE_PROVIDER_SITE_OTHER): Payer: Self-pay | Admitting: Surgery

## 2013-09-11 VITALS — BP 120/70 | HR 72 | Temp 98.6°F | Resp 14 | Ht 63.25 in | Wt 183.2 lb

## 2013-09-11 DIAGNOSIS — Z853 Personal history of malignant neoplasm of breast: Secondary | ICD-10-CM

## 2013-09-11 NOTE — Progress Notes (Signed)
NAME: MARYALYCE SANJUAN       DOB: 12-Mar-1946           DATE: 09/11/2013       MRN: [id number]   Clotee Schlicker is a 67 y.o.Marland Kitchenfemale who presents for routine followup of her DCIS right diagnosed in 12/2011 and treated with partial mastectomy. She has no problems or concerns on either side.  PFSH: She has had no significant changes since the last visit here.  ROS: There have been no significant changes since the last visit here  EXAM:  VS: BP 120/70  Pulse 72  Temp(Src) 98.6 F (37 C) (Temporal)  Resp 14  Ht 5' 3.25" (1.607 m)  Wt 183 lb 3.2 oz (83.099 kg)  BMI 32.18 kg/m2  General: The patient is alert, oriented, generally healthy appearing, NAD. Mood and affect are normal.  Breasts:  right smaller than left no masses bilaterally.  Incision well healed  Lymphatics: She has no axillary or supraclavicular adenopathy on either side.  Extremities: Full ROM of the surgical side with no lymphedema noted.  Data Reviewed: Oncology note Mammogram  2014  No acute changes. Impression: Doing well, with no evidence of recurrent cancer or new cancer  Plan: Will continue to follow up on an annual basis here.Mammogram next spring.

## 2013-09-11 NOTE — Patient Instructions (Signed)
Return 1 year. 

## 2013-11-01 ENCOUNTER — Ambulatory Visit (HOSPITAL_COMMUNITY): Payer: Medicare Other

## 2013-11-01 ENCOUNTER — Other Ambulatory Visit (HOSPITAL_COMMUNITY): Payer: Self-pay | Admitting: Oncology

## 2013-11-01 DIAGNOSIS — D0510 Intraductal carcinoma in situ of unspecified breast: Secondary | ICD-10-CM

## 2013-11-01 MED ORDER — TAMOXIFEN CITRATE 20 MG PO TABS: 20.0000 mg | ORAL_TABLET | Freq: Every day | ORAL | Status: DC

## 2013-11-02 NOTE — Progress Notes (Signed)
This encounter was created in error - please disregard.

## 2013-11-03 ENCOUNTER — Ambulatory Visit (HOSPITAL_COMMUNITY): Payer: Medicare Other

## 2013-11-03 ENCOUNTER — Encounter (HOSPITAL_COMMUNITY): Payer: Medicare Other | Attending: Hematology and Oncology

## 2013-11-03 ENCOUNTER — Encounter (HOSPITAL_COMMUNITY): Payer: Self-pay

## 2013-11-03 VITALS — BP 111/66 | HR 85 | Temp 97.7°F | Resp 20 | Wt 178.4 lb

## 2013-11-03 DIAGNOSIS — D0510 Intraductal carcinoma in situ of unspecified breast: Secondary | ICD-10-CM

## 2013-11-03 DIAGNOSIS — D0590 Unspecified type of carcinoma in situ of unspecified breast: Secondary | ICD-10-CM

## 2013-11-03 DIAGNOSIS — E559 Vitamin D deficiency, unspecified: Secondary | ICD-10-CM

## 2013-11-03 DIAGNOSIS — J309 Allergic rhinitis, unspecified: Secondary | ICD-10-CM | POA: Insufficient documentation

## 2013-11-03 DIAGNOSIS — D1803 Hemangioma of intra-abdominal structures: Secondary | ICD-10-CM

## 2013-11-03 NOTE — Progress Notes (Signed)
[place], [place] No address on file  DIAGNOSIS: DCIS (ductal carcinoma in situ)  Hemangioma of liver  Allergic rhinitis  Vitamin D deficiency  Chief Complaint  Patient presents with  . Non invasive ductal neoplasia breast    CURRENT THERAPY: Tamoxifen 20 mg daily  INTERVAL HISTORY: Paula [name] 68 y.o. 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 Her primary problem is posterior nasal drip with cough and wheezing. She just had allergy skin testing done and she is not allergic to anything. She does have a washing machine at home which apparently has a lot of moles. She denies any abnormalities on self breast examination or lymphedema. She denies any vaginal bleeding or discharge. No history of dysuria, hematuria, incontinence, hot flashes, lower extremity swelling or redness, PND, orthopnea, palpitations, chest pain, skin rash, headache, or seizures.  MEDICAL HISTORY: Past Medical History  Diagnosis Date  . Hypertension   . Hyperlipidemia   . Gilbert's syndrome   . Conjunctival disorder, other   . Bronchitis   . Shortness of breath   . Arthritis   . GERD (gastroesophageal reflux disease)   . Breast cancer     right side DCIS  . Radiation     completed after 33 treatments  . Vitamin D deficiency   . Irregular heart beat     INTERIM HISTORY: has DCIS (ductal carcinoma in situ); History of breast cancer; Allergic rhinitis; and Vitamin D deficiency on her problem list.   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 Estrogen receptors +100%, progesterone receptors +94%. She had a lumpectomy followed by radiation therapy which finished in July 2013. She started tamoxifen  05/09/2012.  ALLERGIES:  is  allergic to amoxicillin; ceclor; and doxycycline.  MEDICATIONS: has a current medication list which includes the following prescription(s): albuterol, aspirin ec, azelastine hcl, beclomethasone, ergocalciferol, fexofenadine hcl, fish oil-omega-3 fatty acids, furosemide, losartan-hydrochlorothiazide, mometasone, montelukast, multiple vitamins-iron, OVER THE COUNTER MEDICATION, prednisone, ranitidine, simvastatin, tamoxifen, zinc gluconate, ibuprofen, multivitamin, multivitamin-lutein, and salicylic acid.  SURGICAL HISTORY:  Past Surgical History  Procedure Laterality Date  . Tonsillectomy    . Breast biopsy      right breast  . Breast lumpectomy  1982    right side    FAMILY HISTORY: family history includes Cancer in her maternal aunt; Heart disease in her brother, father, maternal uncle, and sister.  SOCIAL HISTORY:  reports that she quit smoking about 22 years ago. She has never used smokeless tobacco. She reports that she does not drink alcohol or use illicit drugs.  REVIEW OF SYSTEMS:  Other than that discussed above is noncontributory.  PHYSICAL EXAMINATION: ECOG PERFORMANCE STATUS: 1 - Symptomatic but completely ambulatory  Blood pressure 111/66, pulse 85, temperature 97.7 F (36.5 C), temperature source Oral, resp. rate 20, weight 178 lb 6.4 oz (80.922 kg).  GENERAL:alert, no distress and comfortable SKIN: skin color, texture, turgor are normal, no rashes or significant lesions EYES: PERLA; Conjunctiva are pink and non-injected, sclera clear OROPHARYNX:no exudate, no erythema on lips, buccal mucosa, or tongue. No sinus tenderness or evidence of otitis. NECK: supple, thyroid normal size, non-tender, without nodularity. No masses CHEST: Status post right lumpectomy with no masses in either breast. LYMPH:  no palpable lymphadenopathy in  the cervical, axillary or inguinal LUNGS: clear to auscultation and percussion with normal breathing effort HEART: regular rate & rhythm and no  murmurs. ABDOMEN:abdomen soft, non-tender and normal bowel sounds MUSCULOSKELETAL:no cyanosis of digits and no clubbing. Range of motion normal.  NEURO: alert & oriented x 3 with fluent speech, no focal motor/sensory deficits   LABORATORY DATA: No visits with results within 30 Day(s) from this visit. Latest known visit with results is:  Office Visit on 05/01/2013  Component Date Value Range Status  . Creatinine, Ser 05/01/2013 0.85  0.50 - 1.10 mg/dL Final  . GFR calc non Af Amer 05/01/2013 70* >90 mL/min Final  . GFR calc Af Amer 05/01/2013 81* >90 mL/min Final   Comment:                                 The eGFR has been calculated                          using the CKD EPI equation.                          This calculation has not been                          validated in all clinical                          situations.                          eGFR's persistently                          <90 mL/min signify                          possible Chronic Kidney Disease.    PATHOLOGY: No new pathology.  Urinalysis No results found for this basename: colorurine,  appearanceur,  labspec,  phurine,  glucoseu,  hgbur,  bilirubinur,  ketonesur,  proteinur,  urobilinogen,  nitrite,  leukocytesur    RADIOGRAPHIC STUDIES: Mammography done at the [facility] was apparently normal  ASSESSMENT:  #1. Noninvasive ductal neoplasia the right breast, status post lumpectomy, radiotherapy, and tolerating tamoxifen well. Mammograms are being done in an institution that is not in [place]. #2. Allergic rhinitis with asthma, on treatment. #3. Liver hemangioma, asymptomatic. #4. Vitamin D deficiency, currently on 50,000 units of vitamin D twice a week.   PLAN:  #1. Continue tamoxifen 20 mg daily. Attempts will be made to secure her lab tests and mammogram reports. #2. Followup in 6 months.   All questions were answered. The patient knows to call the clinic with any problems, questions or  concerns. We can certainly see the patient much sooner if necessary.   I spent 25 minutes counseling the patient face to face. The total time spent in the appointment was 30 minutes.    Doroteo Bradford, MD 11/03/2013 12:03 PM

## 2013-11-03 NOTE — Patient Instructions (Signed)
[facility] Discharge Instructions  RECOMMENDATIONS MADE BY THE CONSULTANT AND ANY TEST RESULTS WILL BE SENT TO YOUR REFERRING PHYSICIAN.  EXAM FINDINGS BY THE PHYSICIAN TODAY AND SIGNS OR SYMPTOMS TO REPORT TO CLINIC OR PRIMARY PHYSICIAN: Exam and findings as discussed by Dr. Barnet Glasgow.  INSTRUCTIONS/FOLLOW-UP: 1.  Continue with labs and mammograms as scheduled. 2.  Return in 6 months for an office visit; sooner if questions or concerns.  Report any new lumps, bumps, changes, etc.  Thank you for choosing [facility] to provide your oncology and hematology care.  To afford each patient quality time with our providers, please arrive at least 15 minutes before your scheduled appointment time.  With your help, our goal is to use those 15 minutes to complete the necessary work-up to ensure our physicians have the information they need to help with your evaluation and healthcare recommendations.    Effective January 1st, 2014, we ask that you re-schedule your appointment with our physicians should you arrive 10 or more minutes late for your appointment.  We strive to give you quality time with our providers, and arriving late affects you and other patients whose appointments are after yours.    Again, thank you for choosing [facility].  Our hope is that these requests will decrease the amount of time that you wait before being seen by our physicians.       _____________________________________________________________  Should you have questions after your visit to [facility], please contact our office at [phone] between the hours of 8:30 a.m. and 5:00 p.m.  Voicemails left after 4:30 p.m. will not be returned until the following business day.  For prescription refill requests, have your pharmacy contact our office with your prescription refill request.

## 2014-04-16 HISTORY — PX: OTHER SURGICAL HISTORY: SHX169

## 2014-05-02 ENCOUNTER — Encounter (HOSPITAL_COMMUNITY): Payer: Self-pay

## 2014-05-02 ENCOUNTER — Encounter (HOSPITAL_COMMUNITY): Payer: Medicare Other | Attending: Hematology

## 2014-05-02 VITALS — BP 138/60 | HR 67 | Temp 98.2°F | Resp 18 | Wt 180.0 lb

## 2014-05-02 DIAGNOSIS — D0511 Intraductal carcinoma in situ of right breast: Secondary | ICD-10-CM

## 2014-05-02 DIAGNOSIS — D0590 Unspecified type of carcinoma in situ of unspecified breast: Secondary | ICD-10-CM

## 2014-05-02 NOTE — Patient Instructions (Signed)
[facility] Discharge Instructions  RECOMMENDATIONS MADE BY THE CONSULTANT AND ANY TEST RESULTS WILL BE SENT TO YOUR REFERRING PHYSICIAN.  Exam per Dr.Heller. He recommends follow up in 6 months with [place]. He recommended you discuss with GYN repeat Ultrasound in 6 months. He recommended Bone Density to be repeated now that you are taking Vitamin D. Continue all medications as prescribed. Please make sure that you have had your yearly mammogram. Ask all other offices that provide medical care for you to send [place] copies of notes, labs and xrays. Report any issues/concerns as needed prior to appointment.  Thank you for choosing [facility] to provide your oncology and hematology care.  To afford each patient quality time with our providers, please arrive at least 15 minutes before your scheduled appointment time.  With your help, our goal is to use those 15 minutes to complete the necessary work-up to ensure our physicians have the information they need to help with your evaluation and healthcare recommendations.    Effective January 1st, 2014, we ask that you re-schedule your appointment with our physicians should you arrive 10 or more minutes late for your appointment.  We strive to give you quality time with our providers, and arriving late affects you and other patients whose appointments are after yours.    Again, thank you for choosing [facility]  Our hope is that these requests will decrease the amount of time that you wait before being seen by our physicians.       _____________________________________________________________  Should you have questions after your visit to [facility], please contact our office at [phone] between the hours of 8:30 a.m. and 4:30 p.m.  Voicemails left after 4:30 p.m. will not be returned until the following business day.  For prescription refill requests, have your pharmacy contact our office  with your prescription refill request.    _______________________________________________________________  We hope that we have given you very good care.  You may receive a patient satisfaction survey in the mail, please complete it and return it as soon as possible.  We value your feedback!  _______________________________________________________________  Have you asked about our STAR program?  STAR stands for Survivorship Training and Rehabilitation, and this is a nationally recognized cancer care program that focuses on survivorship and rehabilitation.  Cancer and cancer treatments may cause problems, such as, pain, making you feel tired and keeping you from doing the things that you need or want to do. Cancer rehabilitation can help. Our goal is to reduce these troubling effects and help you have the best quality of life possible.  You may receive a survey from a nurse that asks questions about your current state of health.  Based on the survey results, all eligible patients will be referred to the [facility] program for an evaluation so we can better serve you!  A frequently asked questions sheet is available upon request.

## 2014-05-03 ENCOUNTER — Ambulatory Visit (HOSPITAL_COMMUNITY): Payer: Medicare Other

## 2014-05-03 NOTE — Progress Notes (Signed)
[place] OFFICE PROGRESS NOTE  PCP [facility], MD No address on file   DIAGNOSIS: DCIS (ductal carcinoma in situ), right  CURRENT THERAPY:  Tamoxifen 20 mg. daily  HEMATOLOGY/ONCOLOGY HX: Paula [name] 68 y.o. 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 DCIS of the right breast was, intermediate grade, 2.5 cm in size with negative margins. Estrogen receptors +100%, progesterone receptors +94%.     MEDICAL HISTORY:  Past Medical History  Diagnosis Date  . Hypertension   . Hyperlipidemia   . Gilbert's syndrome   . Conjunctival disorder, other   . Bronchitis   . Shortness of breath   . Arthritis   . GERD (gastroesophageal reflux disease)   . Breast cancer     right side DCIS  . Radiation     completed after 33 treatments  . Vitamin D deficiency   . Irregular heart beat     has DCIS (ductal carcinoma in situ); History of breast cancer; Allergic rhinitis; and Vitamin D deficiency on her problem list.    ALLERGIES:  is allergic to amoxicillin; ceclor; doxycycline; and penicillins.  MEDICATIONS: has a current medication list which includes the following prescription(s): aspirin ec, beclomethasone, ergocalciferol, esomeprazole, fexofenadine hcl, fish oil-omega-3 fatty acids, furosemide, losartan-hydrochlorothiazide, mometasone, montelukast, multivitamin, multiple vitamins-iron, OVER THE COUNTER MEDICATION, simvastatin, tamoxifen, zinc gluconate, albuterol, ibuprofen, and multivitamin-lutein.  FAMILY HISTORY: family history includes Cancer in her maternal aunt; Heart disease in her brother, father, maternal uncle, and sister.  REVIEW OF SYSTEMS:    SINCE YOUR LAST VISIT Been diagnosed or treated for a new medical /surgical  problem or condition: Yes Any Recent Xrays or  studies performed: No Any new prescription or OTC medications: No ECOG Perf Status: Fully active, able to carry on all pre-disease performance without restriction Problems sleeping: No Medications taken to help sleep: No How is your appetie: 100% normal Any Supplements: No Any trouble chewing or swallowing: No Any Nausea or Vomiting: No Any Bowel problems: No # Bowel Movements per week: 7 Any Urinary Issues: No Any Cardiac Problems: Yes ("flutters",edema intermittently LE's) Any Respiratory Issues: No Any Neurological Issues: Yes (few more headaches) Do you live alone: No Feelings hopelessness: No You or your family have any concerns or Health changes: No Pain Assessment Pain Score: 0-No pain  Other than that discussed above is noncontributory.   PHYSICAL EXAMINATION:   weight is 180 lb (81.647 kg). Her oral temperature is 98.2 F (36.8 C). Her blood pressure is 138/60 and her pulse is 67. Her respiration is 18.    GENERAL: alert, no distress and comfortable SKIN: skin color, texture, turgor are normal, no rashes or significant lesions. No cyanosis of digits and no clubbing.  EYES: PERLA; Conjunctiva are pink and non-injected, sclera clear OROPHARYNX: no exudate, no erythema on lips, buccal mucosa, or tongue. NECK: supple, thyroid normal size, non-tender, without nodularity. No masses LYMPH:  no palpable lymphadenopathy in the cervical, axillary or inguinal BREASTS: s/p right lumpectomy with no masses in either breast. LUNGS: clear to auscultation and percussion with normal breathing effort,  HEART: regular rate & rhythm and no murmurs.  ABDOMEN: abdomen soft, non-tender and normal bowel sounds, without masses MUSCULOSKELETAL/EXTREMITIES: No spine or CVA tenderness. No edema. NEURO: alert & oriented x 3 with fluent speech, no focal motor/sensory deficits   LABORATORY DATA:  No visits with results within 30 Day(s) from this visit. Latest known visit with results  is:  Office Visit on 05/01/2013  Component Date Value Ref Range Status  . Creatinine, Ser 05/01/2013 0.85  0.50 - 1.10 mg/dL Final  . GFR calc non Af Amer 05/01/2013 70* >90 mL/min Final  . GFR calc Af Amer 05/01/2013 81* >90 mL/min Final   Comment:                                 The eGFR has been calculated                          using the CKD EPI equation.                          This calculation has not been                          validated in all clinical                          situations.                          eGFR's persistently                          <90 mL/min signify                          possible Chronic Kidney Disease.    PATHOLOGY: No new results   RADIOGRAPHIC STUDIES: Mammogram performed in [place] in June of 2014 show post surgical changes without suspicious changes in either breast. Bone Density showed osteopenia.   ASSESSMENT:  1. Noninvasive ductal neoplasia the right breast, status post lumpectomy, radiotherapy, and tolerating tamoxifen well.   RECOMMENDATIONS:  1. Contact [facility] at home to schedule a mammogram and repeat bone density. Continue Calcium and Vitamin D. Followup at [facility] in 6 months    All questions were answered. The patient knows to call the clinic with any problems, questions or concerns. We can certainly see the patient much sooner if necessary.    Darrall Dears, MD 05/03/2014 8:19 PM

## 2014-07-30 ENCOUNTER — Other Ambulatory Visit (HOSPITAL_COMMUNITY): Payer: Self-pay | Admitting: Oncology

## 2014-07-30 DIAGNOSIS — D0511 Intraductal carcinoma in situ of right breast: Secondary | ICD-10-CM

## 2014-07-30 MED ORDER — TAMOXIFEN CITRATE 20 MG PO TABS: 20.0000 mg | ORAL_TABLET | Freq: Every day | ORAL | Status: DC

## 2014-07-31 ENCOUNTER — Other Ambulatory Visit (HOSPITAL_COMMUNITY): Payer: Self-pay | Admitting: Oncology

## 2014-07-31 DIAGNOSIS — D0511 Intraductal carcinoma in situ of right breast: Secondary | ICD-10-CM

## 2014-07-31 MED ORDER — TAMOXIFEN CITRATE 20 MG PO TABS: 20.0000 mg | ORAL_TABLET | Freq: Every day | ORAL | Status: DC

## 2014-08-17 ENCOUNTER — Other Ambulatory Visit (HOSPITAL_COMMUNITY): Payer: Self-pay | Admitting: Oncology

## 2014-08-17 DIAGNOSIS — D0511 Intraductal carcinoma in situ of right breast: Secondary | ICD-10-CM

## 2014-08-17 MED ORDER — TAMOXIFEN CITRATE 20 MG PO TABS: 20.0000 mg | ORAL_TABLET | Freq: Every day | ORAL | Status: DC

## 2014-10-31 ENCOUNTER — Ambulatory Visit (HOSPITAL_COMMUNITY): Payer: Medicare Other | Admitting: Hematology & Oncology

## 2014-11-15 ENCOUNTER — Encounter (HOSPITAL_COMMUNITY): Payer: Medicare Other | Attending: Hematology & Oncology | Admitting: Hematology & Oncology

## 2014-11-15 ENCOUNTER — Encounter (HOSPITAL_COMMUNITY): Payer: Self-pay | Admitting: Hematology & Oncology

## 2014-11-15 VITALS — BP 150/71 | HR 61 | Temp 97.8°F | Resp 16 | Wt 184.2 lb

## 2014-11-15 DIAGNOSIS — D0511 Intraductal carcinoma in situ of right breast: Secondary | ICD-10-CM

## 2014-11-15 DIAGNOSIS — N939 Abnormal uterine and vaginal bleeding, unspecified: Secondary | ICD-10-CM

## 2014-11-15 DIAGNOSIS — Z170 Estrogen receptor positive status [ER+]: Secondary | ICD-10-CM

## 2014-11-15 NOTE — Patient Instructions (Signed)
[facility] Discharge Instructions  RECOMMENDATIONS MADE BY THE CONSULTANT AND ANY TEST RESULTS WILL BE SENT TO YOUR REFERRING PHYSICIAN.  SPECIAL INSTRUCTIONS/FOLLOW-UP:  Please call prior to your next visit with any problems or concerns. We will see you back again in 6 months with labs and an office visit. If you have labs at another physicians office prior to follow-up here you may bring a copy of them with you. You will be due for another screening mammogram in June of this year!   Thank you for choosing [facility] to provide your oncology and hematology care.  To afford each patient quality time with our providers, please arrive at least 15 minutes before your scheduled appointment time.  With your help, our goal is to use those 15 minutes to complete the necessary work-up to ensure our physicians have the information they need to help with your evaluation and healthcare recommendations.    Effective January 1st, 2014, we ask that you re-schedule your appointment with our physicians should you arrive 10 or more minutes late for your appointment.  We strive to give you quality time with our providers, and arriving late affects you and other patients whose appointments are after yours.    Again, thank you for choosing [facility].  Our hope is that these requests will decrease the amount of time that you wait before being seen by our physicians.       _____________________________________________________________  Should you have questions after your visit to [facility], please contact our office at [phone] between the hours of 8:30 a.m. and 5:00 p.m.  Voicemails left after 4:30 p.m. will not be returned until the following business day.  For prescription refill requests, have your pharmacy contact our office with your prescription refill request.

## 2014-11-15 NOTE — Progress Notes (Signed)
[place], MD No address on file    DIAGNOSIS: DCIS (ductal carcinoma in situ)   Staging form: Breast, AJCC 7th Edition     Clinical: Tis, N0, cM0 - Unsigned     Pathologic: Tis, N0, cM0 - Unsigned  ER+ 100%, PR+ 94% Right Breast  Right noninvasive ductal neoplasia of the breast, status post lumpectomy and radiotherapy currently taking tamoxifen 20 mg daily, the latter of which was started on 05/09/2012.  CURRENT THERAPY: Tamoxifen 20 mg daily  INTERVAL HISTORY: Paula [name] 69 y.o. 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 She continues on tamoxifen and denies hot flashes, change in appetite, joint pain or any other significant problems. She has had a screening colonoscopy. She is up-to-date on screening mammography, with her next mammogram in June of this year. She fell in October and broke both of her elbows and ended up with bilateral casts. He describes that is quite an ordeal. She had an episode of vaginal bleeding that was evaluated by Dr. Sherre Lain at [facility] care in [place]. She states she was advised she could continue tamoxifen.  MEDICAL HISTORY: Past Medical History  Diagnosis Date  . Hypertension   . Hyperlipidemia   . Gilbert's syndrome   . Conjunctival disorder, other   . Bronchitis   . Shortness of breath   . Arthritis   . GERD (gastroesophageal reflux disease)   . Breast cancer     right side DCIS  . Radiation     completed after 33 treatments  . Vitamin D deficiency   . Irregular heart beat     has DCIS (ductal carcinoma in situ); History of breast cancer; Allergic rhinitis; Vitamin D deficiency; and Vaginal bleeding on her problem list.     is allergic to amoxicillin; ceclor; doxycycline; and penicillins.  Paula [name] does not currently have medications on file.  SURGICAL HISTORY: Past Surgical History  Procedure Laterality Date  . Tonsillectomy    . Breast biopsy      right breast  . Breast  lumpectomy  1982    right side  . History of hysteroscopy  04/16/14    SOCIAL HISTORY: History   Social History  . Marital Status: Divorced    Spouse Name: N/A    Number of Children: N/A  . Years of Education: N/A   Occupational History  . Not on file.   Social History Main Topics  . Smoking status: Former Smoker    Quit date: 02/03/1991  . Smokeless tobacco: Never Used  . Alcohol Use: No  . Drug Use: No  . Sexual Activity: Not on file   Other Topics Concern  . Not on file   Social History Narrative    FAMILY HISTORY: Family History  Problem Relation Age of Onset  . Heart disease Father   . Heart disease Sister   . Heart disease Brother   . Cancer Maternal Aunt     unaware  . Heart disease Maternal Uncle     Review of Systems  Constitutional: Negative for fever, chills, weight loss and malaise/fatigue.  HENT: Negative for congestion, hearing loss, nosebleeds, sore throat and tinnitus.   Eyes: Negative for blurred vision, double vision, pain and discharge.  Respiratory: Negative for cough, hemoptysis, sputum production, shortness of breath and wheezing.   Cardiovascular: Negative for chest pain, palpitations, claudication, leg swelling and PND.  Gastrointestinal: Negative for heartburn, nausea, vomiting, abdominal pain, diarrhea, constipation, blood in stool  and melena.  Genitourinary: Negative for dysuria, urgency, frequency and hematuria.  Musculoskeletal: Positive for joint pain. Negative for myalgias, back pain, falls and neck pain.  Skin: Negative for itching and rash.  Neurological: Negative for dizziness, tingling, tremors, sensory change, speech change, focal weakness, seizures, loss of consciousness, weakness and headaches.  Endo/Heme/Allergies: Does not bruise/bleed easily.  Psychiatric/Behavioral: Negative for depression, suicidal ideas, memory loss and substance abuse. The patient is not nervous/anxious and does not have insomnia.     PHYSICAL  EXAMINATION  ECOG PERFORMANCE STATUS: 0 - Asymptomatic  Filed Vitals:   11/15/14 1306  BP: 150/71  Pulse: 61  Temp: 97.8 F (36.6 C)  Resp: 16    Physical Exam  Constitutional: She is oriented to person, place, and time and well-developed, well-nourished, and in no distress.  HENT:  Head: Normocephalic and atraumatic.  Nose: Nose normal.  Mouth/Throat: Oropharynx is clear and moist. No oropharyngeal exudate.  Eyes: Conjunctivae and EOM are normal. Pupils are equal, round, and reactive to light. Right eye exhibits no discharge. Left eye exhibits no discharge. No scleral icterus.  Neck: Normal range of motion. Neck supple. No tracheal deviation present. No thyromegaly present.  Cardiovascular: Normal rate, regular rhythm and normal heart sounds.  Exam reveals no gallop and no friction rub.   No murmur heard. Pulmonary/Chest: Effort normal and breath sounds normal. She has no wheezes. She has no rales.  Bilateral breast exam without palpable masses, nipple changes or skin changes.  Bilateral axilllae and supraclavicular areas without palpable LAD. She complains of mild breast tenderness on the R with palpation but states this is chronic  Abdominal: Soft. Bowel sounds are normal. She exhibits no distension and no mass. There is no tenderness. There is no rebound and no guarding.  Musculoskeletal: Normal range of motion. She exhibits no edema.  Lymphadenopathy:    She has no cervical adenopathy.  Neurological: She is alert and oriented to person, place, and time. She has normal reflexes. No cranial nerve deficit. Gait normal. Coordination normal.  Skin: Skin is warm and dry. No rash noted.  Psychiatric: Mood, memory, affect and judgment normal.  Nursing note and vitals reviewed.   LABORATORY DATA:  CBC    Component Value Date/Time   WBC 5.5 10/24/2012 1247   RBC 4.12 10/24/2012 1247   HGB 12.8 10/24/2012 1247   HCT 38.2 10/24/2012 1247   PLT 244 10/24/2012 1247   MCV 92.7  10/24/2012 1247   MCH 31.1 10/24/2012 1247   MCHC 33.5 10/24/2012 1247   RDW 12.8 10/24/2012 1247   LYMPHSABS 1.1 10/24/2012 1247   MONOABS 0.6 10/24/2012 1247   EOSABS 0.1 10/24/2012 1247   BASOSABS 0.0 10/24/2012 1247   CMP     Component Value Date/Time   NA 141 10/24/2012 1247   K 3.6 10/24/2012 1247   CL 103 10/24/2012 1247   CO2 29 10/24/2012 1247   GLUCOSE 85 10/24/2012 1247   BUN 15 10/24/2012 1247   CREATININE 0.85 05/01/2013 1031   CALCIUM 9.3 10/24/2012 1247   PROT 6.8 10/24/2012 1247   ALBUMIN 3.6 10/24/2012 1247   AST 21 10/24/2012 1247   ALT 18 10/24/2012 1247   ALKPHOS 52 10/24/2012 1247   BILITOT 0.8 10/24/2012 1247   GFRNONAA 70* 05/01/2013 1031   GFRAA 81* 05/01/2013 1031   CBC from 11/08/2014 shows a normal CBC.   ASSESSMENT and THERAPY PLAN:    DCIS (ductal carcinoma in situ) Pleasant 69 year old female with a history of DCIS,  ER positive, PR positive. She started tamoxifen in July 2013. She had an episode of vaginal bleeding late last year and states this was evaluated by her gynecologist. We are having her sign a release to obtain those records. She denies any further episodes. She tolerates tamoxifen well otherwise. She has no major complaints today she is up-to-date on all of her well care. We will plan on seeing her back again in 6 months with laboratory studies. I have advised her if she has labs at an outside facility she may bring them with her to her follow-up appointment. She knows to call in the interim with any problems or concerns.   Vaginal bleeding She had an episode of vaginal bleeding late last year and states she had this evaluated by her gynecologist, Dr. Sherre Lain. Her physician is at [facility] care in [place]. She has agreed to sign a release so we can obtain all of the records of that evaluation. She has had no further episodes. She is aware of the uterine risks associated with tamoxifen use. I have advised her if she has any additional  episodes of vaginal bleeding she is to notify us immediately as well as her gynecologist. Again we will obtain the records from her evaluation for review and for our records here.     All questions were answered. The patient knows to call the clinic with any problems, questions or concerns. We can certainly see the patient much sooner if necessary.  Molli Hazard 11/16/2014

## 2014-11-16 ENCOUNTER — Encounter (HOSPITAL_COMMUNITY): Payer: Self-pay | Admitting: Hematology & Oncology

## 2014-11-16 DIAGNOSIS — N939 Abnormal uterine and vaginal bleeding, unspecified: Secondary | ICD-10-CM | POA: Insufficient documentation

## 2014-11-16 NOTE — Assessment & Plan Note (Signed)
She had an episode of vaginal bleeding late last year and states she had this evaluated by her gynecologist, Dr. Sherre Lain. Her physician is at [facility] care in [place]. She has agreed to sign a release so we can obtain all of the records of that evaluation. She has had no further episodes. She is aware of the uterine risks associated with tamoxifen use. I have advised her if she has any additional episodes of vaginal bleeding she is to notify us immediately as well as her gynecologist. Again we will obtain the records from her evaluation for review and for our records here.

## 2014-11-16 NOTE — Assessment & Plan Note (Signed)
Pleasant 69 year old female with a history of DCIS, ER positive, PR positive. She started tamoxifen in July 2013. She had an episode of vaginal bleeding late last year and states this was evaluated by her gynecologist. We are having her sign a release to obtain those records. She denies any further episodes. She tolerates tamoxifen well otherwise. She has no major complaints today she is up-to-date on all of her well care. We will plan on seeing her back again in 6 months with laboratory studies. I have advised her if she has labs at an outside facility she may bring them with her to her follow-up appointment. She knows to call in the interim with any problems or concerns.

## 2015-03-22 ENCOUNTER — Other Ambulatory Visit: Payer: Self-pay

## 2015-03-22 DIAGNOSIS — Z1231 Encounter for screening mammogram for malignant neoplasm of breast: Secondary | ICD-10-CM

## 2015-04-03 ENCOUNTER — Ambulatory Visit
Admission: RE | Admit: 2015-04-03 | Discharge: 2015-04-03 | Disposition: A | Discharge status: Home / Self Care | Payer: Medicare Other | Source: Ambulatory Visit

## 2015-04-03 ENCOUNTER — Other Ambulatory Visit: Payer: Self-pay

## 2015-04-03 DIAGNOSIS — Z853 Personal history of malignant neoplasm of breast: Secondary | ICD-10-CM

## 2015-04-03 DIAGNOSIS — Z1231 Encounter for screening mammogram for malignant neoplasm of breast: Secondary | ICD-10-CM

## 2015-04-08 ENCOUNTER — Other Ambulatory Visit: Payer: Self-pay | Admitting: Orthopedic Surgery

## 2015-04-11 ENCOUNTER — Encounter (HOSPITAL_BASED_OUTPATIENT_CLINIC_OR_DEPARTMENT_OTHER): Payer: Self-pay | Admitting: *Deleted

## 2015-04-12 ENCOUNTER — Encounter (HOSPITAL_BASED_OUTPATIENT_CLINIC_OR_DEPARTMENT_OTHER)
Admission: RE | Admit: 2015-04-12 | Discharge: 2015-04-12 | Disposition: A | Discharge status: Home / Self Care | Payer: Medicare Other | Source: Ambulatory Visit | Attending: Orthopedic Surgery | Admitting: Orthopedic Surgery

## 2015-04-12 ENCOUNTER — Other Ambulatory Visit: Payer: Self-pay

## 2015-04-12 DIAGNOSIS — Z79810 Long term (current) use of selective estrogen receptor modulators (SERMs): Secondary | ICD-10-CM | POA: Diagnosis not present

## 2015-04-12 DIAGNOSIS — Z87891 Personal history of nicotine dependence: Secondary | ICD-10-CM | POA: Diagnosis not present

## 2015-04-12 DIAGNOSIS — G5601 Carpal tunnel syndrome, right upper limb: Secondary | ICD-10-CM | POA: Diagnosis present

## 2015-04-12 DIAGNOSIS — Z853 Personal history of malignant neoplasm of breast: Secondary | ICD-10-CM | POA: Diagnosis not present

## 2015-04-12 DIAGNOSIS — K219 Gastro-esophageal reflux disease without esophagitis: Secondary | ICD-10-CM | POA: Diagnosis not present

## 2015-04-12 DIAGNOSIS — Z923 Personal history of irradiation: Secondary | ICD-10-CM | POA: Diagnosis not present

## 2015-04-12 DIAGNOSIS — Z01818 Encounter for other preprocedural examination: Secondary | ICD-10-CM | POA: Diagnosis present

## 2015-04-12 DIAGNOSIS — Z791 Long term (current) use of non-steroidal anti-inflammatories (NSAID): Secondary | ICD-10-CM | POA: Diagnosis not present

## 2015-04-12 DIAGNOSIS — Z7982 Long term (current) use of aspirin: Secondary | ICD-10-CM | POA: Diagnosis not present

## 2015-04-12 DIAGNOSIS — H9190 Unspecified hearing loss, unspecified ear: Secondary | ICD-10-CM | POA: Diagnosis not present

## 2015-04-12 DIAGNOSIS — M1990 Unspecified osteoarthritis, unspecified site: Secondary | ICD-10-CM | POA: Diagnosis not present

## 2015-04-12 DIAGNOSIS — E785 Hyperlipidemia, unspecified: Secondary | ICD-10-CM | POA: Diagnosis not present

## 2015-04-12 DIAGNOSIS — Z7951 Long term (current) use of inhaled steroids: Secondary | ICD-10-CM | POA: Diagnosis not present

## 2015-04-12 DIAGNOSIS — I10 Essential (primary) hypertension: Secondary | ICD-10-CM | POA: Diagnosis not present

## 2015-04-12 DIAGNOSIS — E559 Vitamin D deficiency, unspecified: Secondary | ICD-10-CM | POA: Diagnosis not present

## 2015-04-12 DIAGNOSIS — Z6831 Body mass index (BMI) 31.0-31.9, adult: Secondary | ICD-10-CM | POA: Diagnosis not present

## 2015-04-12 DIAGNOSIS — Z79899 Other long term (current) drug therapy: Secondary | ICD-10-CM | POA: Diagnosis not present

## 2015-04-12 LAB — BASIC METABOLIC PANEL
Anion gap: 9 (ref 5–15)
BUN: 12 mg/dL (ref 6–20)
CALCIUM: 9.1 mg/dL (ref 8.9–10.3)
CHLORIDE: 99 mmol/L — AB (ref 101–111)
CO2: 31 mmol/L (ref 22–32)
Creatinine, Ser: 0.81 mg/dL (ref 0.44–1.00)
GFR calc Af Amer: >60 mL/min (ref >60)
GFR calc non Af Amer: >60 mL/min (ref >60)
Glucose, Bld: 102 mg/dL — ABNORMAL HIGH (ref 65–99)
Potassium: 3.3 mmol/L — ABNORMAL LOW (ref 3.5–5.1)
Sodium: 139 mmol/L (ref 135–145)

## 2015-04-18 ENCOUNTER — Ambulatory Visit (HOSPITAL_BASED_OUTPATIENT_CLINIC_OR_DEPARTMENT_OTHER): Payer: Medicare Other | Admitting: Anesthesiology

## 2015-04-18 ENCOUNTER — Ambulatory Visit (HOSPITAL_BASED_OUTPATIENT_CLINIC_OR_DEPARTMENT_OTHER)
Admission: RE | Admit: 2015-04-18 | Discharge: 2015-04-18 | Disposition: A | Discharge status: Home / Self Care | Payer: Medicare Other | Source: Ambulatory Visit | Attending: Orthopedic Surgery | Admitting: Orthopedic Surgery

## 2015-04-18 ENCOUNTER — Encounter (HOSPITAL_BASED_OUTPATIENT_CLINIC_OR_DEPARTMENT_OTHER)
Admission: RE | Disposition: A | Discharge status: Home / Self Care | Payer: Self-pay | Source: Ambulatory Visit | Attending: Orthopedic Surgery

## 2015-04-18 ENCOUNTER — Encounter (HOSPITAL_BASED_OUTPATIENT_CLINIC_OR_DEPARTMENT_OTHER): Payer: Self-pay | Admitting: Orthopedic Surgery

## 2015-04-18 DIAGNOSIS — G5601 Carpal tunnel syndrome, right upper limb: Secondary | ICD-10-CM | POA: Insufficient documentation

## 2015-04-18 DIAGNOSIS — Z923 Personal history of irradiation: Secondary | ICD-10-CM | POA: Insufficient documentation

## 2015-04-18 DIAGNOSIS — Z87891 Personal history of nicotine dependence: Secondary | ICD-10-CM | POA: Insufficient documentation

## 2015-04-18 DIAGNOSIS — Z791 Long term (current) use of non-steroidal anti-inflammatories (NSAID): Secondary | ICD-10-CM | POA: Insufficient documentation

## 2015-04-18 DIAGNOSIS — Z79810 Long term (current) use of selective estrogen receptor modulators (SERMs): Secondary | ICD-10-CM | POA: Insufficient documentation

## 2015-04-18 DIAGNOSIS — E785 Hyperlipidemia, unspecified: Secondary | ICD-10-CM | POA: Diagnosis not present

## 2015-04-18 DIAGNOSIS — Z6831 Body mass index (BMI) 31.0-31.9, adult: Secondary | ICD-10-CM | POA: Insufficient documentation

## 2015-04-18 DIAGNOSIS — K219 Gastro-esophageal reflux disease without esophagitis: Secondary | ICD-10-CM | POA: Insufficient documentation

## 2015-04-18 DIAGNOSIS — I10 Essential (primary) hypertension: Secondary | ICD-10-CM | POA: Diagnosis not present

## 2015-04-18 DIAGNOSIS — Z7951 Long term (current) use of inhaled steroids: Secondary | ICD-10-CM | POA: Insufficient documentation

## 2015-04-18 DIAGNOSIS — H9190 Unspecified hearing loss, unspecified ear: Secondary | ICD-10-CM | POA: Diagnosis not present

## 2015-04-18 DIAGNOSIS — E559 Vitamin D deficiency, unspecified: Secondary | ICD-10-CM | POA: Insufficient documentation

## 2015-04-18 DIAGNOSIS — Z853 Personal history of malignant neoplasm of breast: Secondary | ICD-10-CM | POA: Insufficient documentation

## 2015-04-18 DIAGNOSIS — Z7982 Long term (current) use of aspirin: Secondary | ICD-10-CM | POA: Insufficient documentation

## 2015-04-18 DIAGNOSIS — Z79899 Other long term (current) drug therapy: Secondary | ICD-10-CM | POA: Insufficient documentation

## 2015-04-18 DIAGNOSIS — M1990 Unspecified osteoarthritis, unspecified site: Secondary | ICD-10-CM | POA: Insufficient documentation

## 2015-04-18 HISTORY — PX: CARPAL TUNNEL RELEASE: SHX101

## 2015-04-18 LAB — POCT HEMOGLOBIN-HEMACUE: Hemoglobin: 12.8 g/dL (ref 12.0–15.0)

## 2015-04-18 SURGERY — CARPAL TUNNEL RELEASE
Anesthesia: Monitor Anesthesia Care | Site: Wrist | Laterality: Right

## 2015-04-18 MED ORDER — HYDROCODONE-ACETAMINOPHEN 5-325 MG PO TABS: 1.0000 | ORAL_TABLET | Freq: Four times a day (QID) | ORAL | Status: DC | PRN

## 2015-04-18 MED ORDER — LIDOCAINE HCL (PF) 0.5 % IJ SOLN
INTRAMUSCULAR | Status: DC | PRN
  Administered 2015-04-18: 30 mL via INTRAVENOUS

## 2015-04-18 MED ORDER — GLYCOPYRROLATE 0.2 MG/ML IJ SOLN: 0.2000 mg | Freq: Once | INTRAMUSCULAR | Status: DC | PRN

## 2015-04-18 MED ORDER — PROPOFOL INFUSION 10 MG/ML OPTIME
INTRAVENOUS | Status: DC | PRN
  Administered 2015-04-18: 50 ug/kg/min via INTRAVENOUS

## 2015-04-18 MED ORDER — VANCOMYCIN HCL IN DEXTROSE 1-5 GM/200ML-% IV SOLN: 1000.0000 mg | INTRAVENOUS | Status: DC

## 2015-04-18 MED ORDER — FENTANYL CITRATE (PF) 100 MCG/2ML IJ SOLN
INTRAMUSCULAR | Status: DC | PRN
  Administered 2015-04-18 (×2): 50 ug via INTRAVENOUS

## 2015-04-18 MED ORDER — CHLORHEXIDINE GLUCONATE 4 % EX LIQD: 60.0000 mL | Freq: Once | CUTANEOUS | Status: DC

## 2015-04-18 MED ORDER — LACTATED RINGERS IV SOLN
INTRAVENOUS | Status: DC
  Administered 2015-04-18: 11:00:00 via INTRAVENOUS

## 2015-04-18 MED ORDER — MIDAZOLAM HCL 5 MG/5ML IJ SOLN
INTRAMUSCULAR | Status: DC | PRN
  Administered 2015-04-18 (×2): 1 mg via INTRAVENOUS

## 2015-04-18 MED ORDER — VANCOMYCIN HCL IN DEXTROSE 1-5 GM/200ML-% IV SOLN
1000.0000 mg | INTRAVENOUS | Status: AC
  Administered 2015-04-18: 1000 mg via INTRAVENOUS

## 2015-04-18 MED ORDER — ONDANSETRON HCL 4 MG/2ML IJ SOLN
INTRAMUSCULAR | Status: DC | PRN
  Administered 2015-04-18: 4 mg via INTRAVENOUS

## 2015-04-18 MED ORDER — FENTANYL CITRATE (PF) 100 MCG/2ML IJ SOLN
INTRAMUSCULAR | Status: AC
  Filled 2015-04-18: qty 4

## 2015-04-18 MED ORDER — VANCOMYCIN HCL IN DEXTROSE 1-5 GM/200ML-% IV SOLN
INTRAVENOUS | Status: AC
  Filled 2015-04-18: qty 200

## 2015-04-18 MED ORDER — MIDAZOLAM HCL 2 MG/2ML IJ SOLN
INTRAMUSCULAR | Status: AC
  Filled 2015-04-18: qty 2

## 2015-04-18 MED ORDER — BUPIVACAINE HCL (PF) 0.25 % IJ SOLN
INTRAMUSCULAR | Status: DC | PRN
  Administered 2015-04-18: 6 mL

## 2015-04-18 MED ORDER — SCOPOLAMINE 1 MG/3DAYS TD PT72: 1.0000 | MEDICATED_PATCH | Freq: Once | TRANSDERMAL | Status: DC | PRN

## 2015-04-18 SURGICAL SUPPLY — 35 items
BLADE SURG 15 STRL LF DISP TIS (BLADE) ×1 IMPLANT
BLADE SURG 15 STRL SS (BLADE) ×2
BNDG COHESIVE 3X5 TAN STRL LF (GAUZE/BANDAGES/DRESSINGS) ×3 IMPLANT
BNDG ESMARK 4X9 LF (GAUZE/BANDAGES/DRESSINGS) ×3 IMPLANT
BNDG GAUZE ELAST 4 BULKY (GAUZE/BANDAGES/DRESSINGS) ×3 IMPLANT
CHLORAPREP W/TINT 26ML (MISCELLANEOUS) ×3 IMPLANT
CORDS BIPOLAR (ELECTRODE) ×3 IMPLANT
COVER BACK TABLE 60X90IN (DRAPES) ×3 IMPLANT
COVER MAYO STAND STRL (DRAPES) ×3 IMPLANT
CUFF TOURNIQUET SINGLE 18IN (TOURNIQUET CUFF) ×3 IMPLANT
DRAPE EXTREMITY T 121X128X90 (DRAPE) ×3 IMPLANT
DRAPE SURG 17X23 STRL (DRAPES) ×3 IMPLANT
DRSG PAD ABDOMINAL 8X10 ST (GAUZE/BANDAGES/DRESSINGS) ×3 IMPLANT
GAUZE SPONGE 4X4 12PLY STRL (GAUZE/BANDAGES/DRESSINGS) ×3 IMPLANT
GAUZE XEROFORM 1X8 LF (GAUZE/BANDAGES/DRESSINGS) ×3 IMPLANT
GLOVE BIOGEL PI IND STRL 7.0 (GLOVE) ×3 IMPLANT
GLOVE BIOGEL PI IND STRL 8.5 (GLOVE) ×1 IMPLANT
GLOVE BIOGEL PI INDICATOR 7.0 (GLOVE) ×6
GLOVE BIOGEL PI INDICATOR 8.5 (GLOVE) ×2
GLOVE ECLIPSE 6.5 STRL STRAW (GLOVE) ×3 IMPLANT
GLOVE SURG ORTHO 8.0 STRL STRW (GLOVE) ×3 IMPLANT
GLOVE SURG SS PI 7.5 STRL IVOR (GLOVE) ×3 IMPLANT
GOWN STRL REUS W/ TWL LRG LVL3 (GOWN DISPOSABLE) ×1 IMPLANT
GOWN STRL REUS W/TWL LRG LVL3 (GOWN DISPOSABLE) ×2
GOWN STRL REUS W/TWL XL LVL3 (GOWN DISPOSABLE) ×3 IMPLANT
NEEDLE PRECISIONGLIDE 27X1.5 (NEEDLE) ×3 IMPLANT
NS IRRIG 1000ML POUR BTL (IV SOLUTION) ×3 IMPLANT
PACK BASIN DAY SURGERY FS (CUSTOM PROCEDURE TRAY) ×3 IMPLANT
STOCKINETTE 4X48 STRL (DRAPES) ×3 IMPLANT
SUT ETHILON 4 0 PS 2 18 (SUTURE) ×3 IMPLANT
SUT VICRYL 4-0 PS2 18IN ABS (SUTURE) IMPLANT
SYR BULB 3OZ (MISCELLANEOUS) ×3 IMPLANT
SYR CONTROL 10ML LL (SYRINGE) ×3 IMPLANT
TOWEL OR 17X24 6PK STRL BLUE (TOWEL DISPOSABLE) ×3 IMPLANT
UNDERPAD 30X30 (UNDERPADS AND DIAPERS) ×3 IMPLANT

## 2015-04-18 NOTE — Anesthesia Preprocedure Evaluation (Addendum)
Anesthesia Evaluation  Patient identified by MRN, date of birth, ID band Patient awake    Reviewed: Allergy & Precautions, NPO status , Patient's Chart, lab work & pertinent test results  Airway Mallampati: II  TM Distance: >3 FB Neck ROM: Full    Dental   Pulmonary former smoker,  breath sounds clear to auscultation        Cardiovascular hypertension, Pt. on medications Rhythm:Regular Rate:Normal     Neuro/Psych negative neurological ROS     GI/Hepatic Neg liver ROS, GERD-  ,  Endo/Other  Morbid obesity  Renal/GU negative Renal ROS     Musculoskeletal   Abdominal   Peds  Hematology negative hematology ROS (+)   Anesthesia Other Findings   Reproductive/Obstetrics                            Anesthesia Physical Anesthesia Plan  ASA: II  Anesthesia Plan: MAC and Bier Block   Post-op Pain Management:    Induction: Intravenous  Airway Management Planned: Natural Airway and Simple Face Mask  Additional Equipment:   Intra-op Plan:   Post-operative Plan:   Informed Consent: I have reviewed the patients History and Physical, chart, labs and discussed the procedure including the risks, benefits and alternatives for the proposed anesthesia with the patient or authorized representative who has indicated his/her understanding and acceptance.     Plan Discussed with: CRNA  Anesthesia Plan Comments:         Anesthesia Quick Evaluation

## 2015-04-18 NOTE — Discharge Instructions (Addendum)

## 2015-04-18 NOTE — Anesthesia Postprocedure Evaluation (Signed)
  Anesthesia Post-op Note  Patient: Paula [name]  Procedure(s) Performed: Procedure(s) with comments: RIGHT CARPAL TUNNEL RELEASE (Right) - REGIONAL/FAB  Patient Location: PACU  Anesthesia Type:MAC and Bier block  Level of Consciousness: awake, alert  and oriented  Airway and Oxygen Therapy: Patient Spontanous Breathing  Post-op Pain: mild  Post-op Assessment: Post-op Vital signs reviewed              Post-op Vital Signs: Reviewed  Last Vitals:  Filed Vitals:   04/18/15 1230  BP: 134/75  Pulse: 59  Temp:   Resp: 13    Complications: No apparent anesthesia complications

## 2015-04-18 NOTE — H&P (Signed)
Paula [name] is a 69 year old right hand dominant female referred by Dr. Manuella Ghazi for a consultation with respect to arm pain. This does not localize well. She states this began with a fall on 08-01-14.  She fell on her outstretched hand. She was on a sidewalk in [facility] and her right foot rolled over and then she hit a stump. She was pitched down a hill and caught her heel and chin suffering a fracture of her elbow. She now complains of moderate, mild occasionally severe pain to her right arm. This includes her wrist with a feeling of numbness and weakness. She has seen a chiropractor for this. She states it has gotten somewhat better. It does awaken her at night. Activity, exercise and work makes this worse, rest, heat and ice have helped. She has taken 2 Tylenol extra strength during the day and 2 Motrin during the day. She has worn the brace which has been discarded. She does not localize her pain well. This appears to be primarily on the radial side of her hand and wrist. She has no prior history of injury. There is no history of diabetes, thyroid problems, arthritis or gout. NCV's are positive for carpal tunnel syndrome.  PAST MEDICAL HISTORY: She is allergic to Sequel, Amoxicillin and Doxycycline. She is on Q-Var, Nasonex, losartan, Tamoxifen, vitamin D, simvastatin, Montelukast, and furosemide and aspirin. She has had a tonsillectomy, lumpectomy, non-radical breast surgery.  FAMILY MEDICAL HISTORY: Positive for diabetes, heart disease, high BP and arthritis.  SOCIAL HISTORY:  She does not smoke or use alcohol. She is divorced and a Emergency planning/management officer.  REVIEW OF SYSTEMS: Positive for right breast cancer, glasses, hearing loss, high BP, ringing in her ears, pneumonia, shortness of breath, blood in her urine, rash, lumps, headaches, and easy bruising otherwise negative 14 points.  Paula [name] is an 69 y.o. female.   Chief Complaint: numbness right hand HPI: see above  Past Medical History   Diagnosis Date  . Hypertension   . Hyperlipidemia   . Gilbert's syndrome   . Conjunctival disorder, other   . Bronchitis   . Shortness of breath   . Arthritis   . GERD (gastroesophageal reflux disease)   . Breast cancer     right side DCIS  . Radiation     completed after 33 treatments  . Vitamin D deficiency   . Irregular heart beat     Past Surgical History  Procedure Laterality Date  . Tonsillectomy    . Breast biopsy      right breast  . Breast lumpectomy  1982    right side  . History of hysteroscopy  04/16/14    Family History  Problem Relation Age of Onset  . Heart disease Father   . Heart disease Sister   . Heart disease Brother   . Cancer Maternal Aunt     unaware  . Heart disease Maternal Uncle    Social History:  reports that she quit smoking about 24 years ago. She has never used smokeless tobacco. She reports that she does not drink alcohol or use illicit drugs.  Allergies:  Allergies  Allergen Reactions  . Amoxicillin Rash  . Ceclor [Cefaclor] Rash  . Doxycycline Rash  . Penicillins Rash    Medications Prior to Admission  Medication Sig Dispense Refill  . aspirin EC 81 MG tablet Take 81 mg by mouth every other day.    . beclomethasone (QVAR) 40 MCG/ACT inhaler Inhale 1 puff into the  lungs 2 (two) times daily.     . Calcium 500-100 MG-UNIT CHEW Chew 2 each by mouth daily.    . ergocalciferol (VITAMIN D2) 50000 UNITS capsule Take 50,000 Units by mouth. Taking 2 x weekly    . Fexofenadine HCl (ALLEGRA PO) Take 180 mg by mouth daily.     . fish oil-omega-3 fatty acids 1000 MG capsule Take 1 g by mouth daily.     . furosemide (LASIX) 40 MG tablet Take 40 mg by mouth. Takes about 2 times weekly    . Ibuprofen (ADVIL PO) Take 200 mg by mouth. Taking 2 - 3 daily    . losartan-hydrochlorothiazide (HYZAAR) 100-25 MG per tablet Take 1 tablet by mouth daily.    . mometasone (NASONEX) 50 MCG/ACT nasal spray Place 2 sprays into the nose daily.    .  montelukast (SINGULAIR) 10 MG tablet Take 10 mg by mouth at bedtime.    . Multiple Vitamin (MULTIVITAMIN) capsule Take 1 capsule by mouth daily.    . Multiple Vitamins-Iron (CHLORELLA PO) Take by mouth.    . multivitamin-lutein (OCUVITE-LUTEIN) CAPS Take 1 capsule by mouth daily.    Marland Kitchen OVER THE COUNTER MEDICATION Take 1 capsule by mouth 2 (two) times daily. Carmine Savoy [employer] vitamin    . simvastatin (ZOCOR) 20 MG tablet Take 20 mg by mouth daily.    . tamoxifen (NOLVADEX) 20 MG tablet Take 1 tablet (20 mg total) by mouth daily. 90 tablet 3  . zinc gluconate 50 MG tablet Take 50 mg by mouth daily.    Marland Kitchen acetaminophen (TYLENOL) 650 MG CR tablet Take 650 mg by mouth 2 (two) times daily.    Marland Kitchen albuterol (PROVENTIL HFA;VENTOLIN HFA) 108 (90 BASE) MCG/ACT inhaler Inhale 2 puffs into the lungs 2 (two) times daily.       No results found for this or any previous visit (from the past 48 hour(s)).  No results found.   Pertinent items are noted in HPI.  Height 5\' 3"  (1.6 m), weight 83.462 kg (184 lb).  General appearance: alert, cooperative and appears stated age Head: Normocephalic, without obvious abnormality Neck: no JVD Resp: clear to auscultation bilaterally Cardio: regular rate and rhythm, S1, S2 normal, no murmur, click, rub or gallop GI: soft, non-tender; bowel sounds normal; no masses,  no organomegaly Extremities: numbness right hand Pulses: 2+ and symmetric Skin: Skin color, texture, turgor normal. No rashes or lesions Neurologic: Grossly normal Incision/Wound: na  Assessment/Plan X-rays of her hands reveals mild degenerative changes at the [facility] joint of her thumb.  X-rays of her radial head and neck reveals  union at this time.  X-rays of her wrist reveals no instability, no fractures.  Diagnosis: Carpal tunnel syndrome right  Plan: She desires proceeding to have her carpal tunnel release performed. Pre, peri and post op care are discussed along with risks and  complications. Patient is aware there is no guarantee with surgery, possibility of infection, injury to arteries, nerves, and tendons, incomplete relief and dystrophy. She will call back to schedule right carpal tunnel release as an outpatient under regional anesthesia.  Katena Petitjean R 04/18/2015, 10:31 AM

## 2015-04-18 NOTE — Op Note (Signed)
Dictation Number [id number]

## 2015-04-18 NOTE — Transfer of Care (Signed)
Immediate Anesthesia Transfer of Care Note  Patient: Paula [name]  Procedure(s) Performed: Procedure(s) with comments: RIGHT CARPAL TUNNEL RELEASE (Right) - REGIONAL/FAB  Patient Location: PACU  Anesthesia Type:GA combined with regional for post-op pain  Level of Consciousness: awake and patient cooperative  Airway & Oxygen Therapy: Patient Spontanous Breathing and Patient connected to face mask oxygen  Post-op Assessment: Report given to RN and Post -op Vital signs reviewed and stable  Post vital signs: Reviewed and stable  Last Vitals:  Filed Vitals:   04/18/15 1210  BP:   Pulse: 67  Temp:   Resp:     Complications: No apparent anesthesia complications

## 2015-04-18 NOTE — Anesthesia Procedure Notes (Signed)
Procedure Name: MAC Date/Time: 04/18/2015 11:43 AM Performed by: Maryella Shivers Pre-anesthesia Checklist: Patient identified, Timeout performed, Emergency Drugs available, Suction available and Patient being monitored Patient Re-evaluated:Patient Re-evaluated prior to inductionOxygen Delivery Method: Non-rebreather mask

## 2015-04-18 NOTE — Brief Op Note (Signed)
04/18/2015  12:10 PM  PATIENT:  Clovis Pu  69 y.o. female  PRE-OPERATIVE DIAGNOSIS:  RIGHT CARPAL TUNNEL SYNDROME  POST-OPERATIVE DIAGNOSIS:  RIGHT CARPAL TUNNEL SYNDROME  PROCEDURE:  Procedure(s) with comments: RIGHT CARPAL TUNNEL RELEASE (Right) - REGIONAL/FAB  SURGEON:  Surgeon(s) and Role:    * Daryll Brod, MD - Primary  PHYSICIAN ASSISTANT:   ASSISTANTS: none   ANESTHESIA:   local and regional  EBL:  Total I/O In: 200 [I.V.:200] Out: -   BLOOD ADMINISTERED:none  DRAINS: none   LOCAL MEDICATIONS USED:  BUPIVICAINE   SPECIMEN:  No Specimen  DISPOSITION OF SPECIMEN:  N/A  COUNTS:  YES  TOURNIQUET:   Total Tourniquet Time Documented: Forearm (Right) - 17 minutes Total: Forearm (Right) - 17 minutes   DICTATION: .Other Dictation: Dictation Number [id number]  PLAN OF CARE: Discharge to home after PACU  PATIENT DISPOSITION:  PACU - hemodynamically stable.

## 2015-04-19 ENCOUNTER — Encounter (HOSPITAL_BASED_OUTPATIENT_CLINIC_OR_DEPARTMENT_OTHER): Payer: Self-pay | Admitting: Orthopedic Surgery

## 2015-04-19 NOTE — Op Note (Signed)
Paula [name], Paula [name]               ACCOUNT NO.:  [id number]  MEDICAL RECORD NO.:  [id number]  LOCATION:                               FACILITY:  [facility]  PHYSICIAN:  Daryll Brod, M.D.       DATE OF BIRTH:  08-27-1946  DATE OF PROCEDURE:  04/18/2015 DATE OF DISCHARGE:  04/18/2015                              OPERATIVE REPORT   PREOPERATIVE DIAGNOSIS:  Carpal tunnel syndrome, right hand.  POSTOPERATIVE DIAGNOSIS:  Carpal tunnel syndrome, right hand.  OPERATION:  Decompression right median nerve.  SURGEON:  Daryll Brod, MD.  ANESTHESIA:  Forearm-based IV regional with local infiltration.  ANESTHESIOLOGIST:  Soledad Gerlach, MD.  HISTORY:  The patient is a 69 year old female with a history of carpal tunnel syndrome.  Nerve conduction is positive in right hand.  This has not responded to conservative treatment.  She has elected to undergo surgical decompression to the median nerve.  Pre, peri, and postoperative course have been discussed along with risks and complications.  She is aware that there is no guarantee with surgery, possibility of infection, recurrence of injury to arteries, nerves, tendons, incomplete relief of symptoms, and dystrophy.  In the preoperative area, the patient is seen, the extremity marked by both patient and surgeon.  Antibiotic given.  PROCEDURE IN DETAIL:  The patient was brought to the operating room, where forearm-based IV regional anesthetic was carried out without difficulty.  She was prepped using ChloraPrep, supine position, right arm free.  A 3-minute dry time was allowed.  Time-out taken, confirming the patient and procedure.  A longitudinal incision was made in the right palm, carried down through subcutaneous tissue.  Bleeders were electrocauterized with bipolar.  Palmar fascia was split.  Superficial palmar arch identified.  The flexor tendon of the ring and little finger identified.  To the ulnar side of the median nerve, the  carpal retinaculum was incised with sharp dissection after placement of retractors in the median and ulnar nerve.  A right angle and Sewall retractor were placed between skin and forearm fascia proximally.  This was then released for approximately 2 cm proximal to the wrist crease under direct vision.  The canal was explored.  Air compression to the nerve was apparent.  No further lesions were identified.  The wound was irrigated with saline.  The skin closed with interrupted 4-0 nylon sutures.  The patient had some feeling when the incision was made.  A local infiltration with 0.25% bupivacaine without epinephrine was given, approximately 7 mL was used.  Following closure of the wound, a sterile compressive dressing with the fingers free was applied.  On deflation of the tourniquet, all fingers immediately pinked.  She was taken to the recovery room for observation in satisfactory condition.  She will be discharged home to return to the [facility] in 1 week on [place].    ______________________________ Daryll Brod, M.D.   ______________________________ Daryll Brod, M.D.    GK/MEDQ  D:  04/18/2015  T:  04/19/2015  Job:  [id number]

## 2015-05-16 ENCOUNTER — Other Ambulatory Visit (HOSPITAL_COMMUNITY): Payer: Medicare Other

## 2015-05-16 ENCOUNTER — Ambulatory Visit (HOSPITAL_COMMUNITY): Payer: Medicare Other | Admitting: Hematology & Oncology

## 2015-05-16 ENCOUNTER — Encounter (HOSPITAL_COMMUNITY): Payer: Self-pay | Admitting: Hematology & Oncology

## 2015-05-16 ENCOUNTER — Encounter (HOSPITAL_COMMUNITY): Payer: Medicare Other | Attending: Hematology & Oncology | Admitting: Hematology & Oncology

## 2015-05-16 VITALS — BP 149/69 | HR 79 | Temp 98.4°F | Resp 16 | Wt 181.3 lb

## 2015-05-16 DIAGNOSIS — N939 Abnormal uterine and vaginal bleeding, unspecified: Secondary | ICD-10-CM

## 2015-05-16 DIAGNOSIS — D0510 Intraductal carcinoma in situ of unspecified breast: Secondary | ICD-10-CM

## 2015-05-16 NOTE — Progress Notes (Signed)
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  CURRENT THERAPY: Tamoxifen 20 mg daily  INTERVAL HISTORY: Paula [name] 69 y.o. 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 She continues on tamoxifen and denies hot flashes, change in appetite, joint pain or any other significant problems. She has had a screening colonoscopy. She is up-to-date on screening mammography.,She had an episode of vaginal bleeding that was evaluated by [facility] Sherre Lain [facility] in [place]. She states she was advised she could continue tamoxifen.   Patient is here alone. Reports no more vaginal bleeding, no more problems. Results of everything have come back fine. Mammogram in June was fine. Carpal tunnel release surgery done four or five weeks ago; healing on the hands. She says she's been "eating rotten," and that her chiropractor says she's been swelling in her legs.   She requested a breast exam on her right side, and was asked if she'd noticed a change since the last time she was here. She said she's been so different; "not the same" after breaking both elbows and her shoulder, and feels her breast is also "different." Elbows are doing fairly well; shoulder is still healing. She expresses concern that her right nipple is becoming more flesh-colored instead of pink. She is unsure when this started.She denies any nipple retraction or discharge.  MEDICAL HISTORY: Past Medical History  Diagnosis Date  . Hypertension   . Hyperlipidemia   . Gilbert's syndrome   . Conjunctival disorder, other   . Bronchitis     . Shortness of breath   . Arthritis   . GERD (gastroesophageal reflux disease)   . Breast cancer     right side DCIS  . Radiation     completed after 33 treatments  . Vitamin D deficiency   . Irregular heart beat     has DCIS (ductal carcinoma in situ); History of breast cancer; Allergic rhinitis; Vitamin D deficiency; and Vaginal bleeding on her problem list.     is allergic to amoxicillin; ceclor; doxycycline; and penicillins.  Paula [name] does not currently have medications on file.  SURGICAL HISTORY: Past Surgical History  Procedure Laterality Date  . Tonsillectomy    . Breast biopsy      right breast  . Breast lumpectomy  1982    right side  . History of hysteroscopy  04/16/14  . Carpal tunnel release Right 04/18/2015    Procedure: RIGHT CARPAL TUNNEL RELEASE;  Surgeon: Daryll Brod, MD;  Location: [facility];  Service: Orthopedics;  Laterality: Right;  REGIONAL/FAB    SOCIAL HISTORY: Social History   Social History  . Marital Status: Divorced    Spouse Name: N/A  . Number of Children: N/A  . Years of Education: N/A   Occupational History  . Not on file.   Social History Main Topics  . Smoking status: Former Smoker    Quit date: 02/03/1991  . Smokeless tobacco: Never Used  . Alcohol Use: No  . Drug Use: No  . Sexual Activity: Not on file  Other Topics Concern  . Not on file   Social History Narrative    FAMILY HISTORY: Family History  Problem Relation Age of Onset  . Heart disease Father   . Heart disease Sister   . Heart disease Brother   . Cancer Maternal Aunt     unaware  . Heart disease Maternal Uncle     Review of Systems  Constitutional: Negative for fever, chills, weight loss and malaise/fatigue.  HENT: Negative for congestion, hearing loss, nosebleeds, sore throat and tinnitus.   Eyes: Negative for blurred vision, double vision, pain and discharge.  Respiratory: Negative for cough, hemoptysis, sputum production, shortness  of breath and wheezing.   Cardiovascular: Negative for chest pain, palpitations, claudication, leg swelling and PND.  Gastrointestinal: Negative for heartburn, nausea, vomiting, abdominal pain, diarrhea, constipation, blood in stool and melena.  Genitourinary: Negative for dysuria, urgency, frequency and hematuria.  Musculoskeletal: Positive for joint pain. Negative for myalgias, back pain, falls and neck pain.  Skin: Negative for itching and rash.  Neurological: Negative for dizziness, tingling, tremors, sensory change, speech change, focal weakness, seizures, loss of consciousness, weakness and headaches.  Endo/Heme/Allergies: Does not bruise/bleed easily.  Psychiatric/Behavioral: Negative for depression, suicidal ideas, memory loss and substance abuse. The patient is not nervous/anxious and does not have insomnia.    14 point review of systems was performed and is negative except as detailed under history of present illness and above  PHYSICAL EXAMINATION  ECOG PERFORMANCE STATUS: 0 - Asymptomatic  Filed Vitals:   05/16/15 1410  BP: 149/69  Pulse: 79  Temp: 98.4 F (36.9 C)  Resp: 16    Physical Exam  Constitutional: She is oriented to person, place, and time and well-developed, well-nourished, and in no distress.  HENT:  Head: Normocephalic and atraumatic.  Nose: Nose normal.  Mouth/Throat: Oropharynx is clear and moist. No oropharyngeal exudate.  Eyes: Conjunctivae and EOM are normal. Pupils are equal, round, and reactive to light. Right eye exhibits no discharge. Left eye exhibits no discharge. No scleral icterus.  Neck: Normal range of motion. Neck supple. No tracheal deviation present. No thyromegaly present.  Cardiovascular: Normal rate, regular rhythm and normal heart sounds.  Exam reveals no gallop and no friction rub.   No murmur heard. Pulmonary/Chest: Effort normal and breath sounds normal. She has no wheezes. She has no rales.  Right Breast: Scar tissue in the right  breast at the incision 11-12 o'clock 2.5 cm from the nipple. Nipple is normal. No other palpable abnormalities are noted. No suspicious skin changes. Left Breast: No abnormalities Abdominal: Soft. Bowel sounds are normal. She exhibits no distension and no mass. There is no tenderness. There is no rebound and no guarding.  Musculoskeletal: Normal range of motion. Mild bilateral leg edema. Lymphadenopathy:    She has no cervical adenopathy.  Neurological: She is alert and oriented to person, place, and time. She has normal reflexes. No cranial nerve deficit. Gait normal. Coordination normal.  Skin: Skin is warm and dry. No rash noted.  Psychiatric: Mood, memory, affect and judgment normal.  Nursing note and vitals reviewed.   LABORATORY DATA:  CBC    Component Value Date/Time   WBC 5.5 10/24/2012 1247   RBC 4.12 10/24/2012 1247   HGB 12.8 04/18/2015 1116   HCT 38.2 10/24/2012 1247   PLT 244 10/24/2012 1247   MCV 92.7 10/24/2012 1247   MCH 31.1 10/24/2012 1247   MCHC 33.5 10/24/2012 1247   RDW 12.8 10/24/2012 1247   LYMPHSABS  1.1 10/24/2012 1247   MONOABS 0.6 10/24/2012 1247   EOSABS 0.1 10/24/2012 1247   BASOSABS 0.0 10/24/2012 1247   CMP     Component Value Date/Time   NA 139 04/12/2015 1122   K 3.3* 04/12/2015 1122   CL 99* 04/12/2015 1122   CO2 31 04/12/2015 1122   GLUCOSE 102* 04/12/2015 1122   BUN 12 04/12/2015 1122   CREATININE 0.81 04/12/2015 1122   CALCIUM 9.1 04/12/2015 1122   PROT 6.8 10/24/2012 1247   ALBUMIN 3.6 10/24/2012 1247   AST 21 10/24/2012 1247   ALT 18 10/24/2012 1247   ALKPHOS 52 10/24/2012 1247   BILITOT 0.8 10/24/2012 1247   GFRNONAA >60 04/12/2015 1122   GFRAA >60 04/12/2015 1122   CBC from 11/08/2014 shows a normal CBC.   ASSESSMENT and THERAPY PLAN:  ER positive, PR positive, DCIS of the right breast. Tamoxifen started on 05/09/2012 Episode of vaginal bleeding early 2016 evaluated by patient's gynecologist in [place], patient advised  to continue tamoxifen. Workup was negative.  She is doing well. She is recovering from bilateral elbow fractures. She is also had carpal tunnel surgery. She is up-to-date on all of her well care. She would like to continue on tamoxifen. I again addressed the importance of notifying us or her gynecologist should she have another episode of vaginal bleeding. The patient states she is very aware of the gynecologic side effects of tamoxifen.  We will plan on seeing her back in 6 months with repeat physical exam. She obtains routine laboratory studies with her primary care doctor.  All questions were answered. The patient knows to call the clinic with any problems, questions or concerns. We can certainly see the patient much sooner if necessary.  This document serves as a record of services personally performed by Ancil Linsey, MD. It was created on her behalf by Toni Amend, a trained medical scribe. The creation of this record is based on the scribe's personal observations and the provider's statements to them. This document has been checked and approved by the attending provider.  I have reviewed the above documentation for accuracy and completeness, and I agree with the above.  Molli Hazard 06/22/2015

## 2015-05-16 NOTE — Patient Instructions (Signed)
[facility] at [facility] Discharge Instructions  RECOMMENDATIONS MADE BY THE CONSULTANT AND ANY TEST RESULTS WILL BE SENT TO YOUR REFERRING PHYSICIAN.  Exam and discussion by Dr. Whitney Muse. No changes at present. Report any new lumps, bone pain, shortness of breath or other symptoms.  Follow-up in 6 months.  Thank you for choosing [place] at [facility] to provide your oncology and hematology care.  To afford each patient quality time with our provider, please arrive at least 15 minutes before your scheduled appointment time.    You need to re-schedule your appointment should you arrive 10 or more minutes late.  We strive to give you quality time with our providers, and arriving late affects you and other patients whose appointments are after yours.  Also, if you no show three or more times for appointments you may be dismissed from the clinic at the providers discretion.     Again, thank you for choosing [facility].  Our hope is that these requests will decrease the amount of time that you wait before being seen by our physicians.       _____________________________________________________________  Should you have questions after your visit to [facility], please contact our office at [phone] between the hours of 8:30 a.m. and 4:30 p.m.  Voicemails left after 4:30 p.m. will not be returned until the following business day.  For prescription refill requests, have your pharmacy contact our office.

## 2015-06-22 ENCOUNTER — Encounter (HOSPITAL_COMMUNITY): Payer: Self-pay | Admitting: Hematology & Oncology

## 2015-09-25 ENCOUNTER — Other Ambulatory Visit (HOSPITAL_COMMUNITY): Payer: Self-pay | Admitting: Oncology

## 2015-11-19 ENCOUNTER — Encounter (HOSPITAL_COMMUNITY): Payer: Self-pay | Admitting: Oncology

## 2015-11-19 ENCOUNTER — Ambulatory Visit (HOSPITAL_COMMUNITY): Payer: Medicare Other | Admitting: Hematology & Oncology

## 2015-11-19 ENCOUNTER — Encounter (HOSPITAL_COMMUNITY): Payer: Medicare Other | Attending: Hematology & Oncology | Admitting: Oncology

## 2015-11-19 VITALS — BP 129/68 | HR 66 | Temp 98.0°F | Resp 18 | Wt 184.2 lb

## 2015-11-19 DIAGNOSIS — D0511 Intraductal carcinoma in situ of right breast: Secondary | ICD-10-CM

## 2015-11-19 NOTE — Progress Notes (Signed)
[place], MD No address on file  DCIS (ductal carcinoma in situ), right - Plan: CBC with Differential, Comprehensive metabolic panel  CURRENT THERAPY: Tamoxifen 20 mg daily beginning on 05/09/2012.  Tamoxifen discontinued today, 11/19/2015, secondary to recurrent vaginal bleeding.  INTERVAL HISTORY: Clovis Pu 70 y.o. 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  Started on anti-estrogen therapy with Tamoxifen beginning on 05/09/2012.  I personally reviewed and went over laboratory results with the patient.  The results are noted within this dictation.  I personally reviewed and went over radiographic studies with the patient.  The results are noted within this dictation.  Mammogram on 04/03/2015 was BIRADS 2.  She will be due for her next mammogram in June 2017.  She is educated that she can have her mammograms performed more locally here at [facility].  She is strongly considering this change, but she will let us know.  She reports another episode of vaginal bleeding.  She reports that she had an U/S in [place], [place].  She is now set-up for a hysteroscopy today, 11/19/2015 with a D&C on 11/26/2015.  She notes a cough that does not keep her awake at night. It is nonproductive.  She denies any fevers.  She has seen her primary care provider who is treating her symptomatically which is most appropriate given there is no specific sign of bacterial infection.  She otherwise denies any complaints..  Past Medical History  Diagnosis Date  . Hypertension   . Hyperlipidemia   . Gilbert's syndrome   . Conjunctival disorder, other   . Bronchitis   . Shortness of breath   . Arthritis   . GERD (gastroesophageal reflux disease)   . Breast cancer ([place])     right side DCIS  . Radiation     completed after 33 treatments  . Vitamin D deficiency   . Irregular heart beat     has DCIS (ductal carcinoma in situ); History of breast  cancer; Allergic rhinitis; Vitamin D deficiency; and Vaginal bleeding on her problem list.     is allergic to amoxicillin; ceclor; doxycycline; and penicillins.  Current Outpatient Prescriptions on File Prior to Visit  Medication Sig Dispense Refill  . albuterol (PROVENTIL HFA;VENTOLIN HFA) 108 (90 BASE) MCG/ACT inhaler Inhale 2 puffs into the lungs 2 (two) times daily.     Marland Kitchen aspirin EC 81 MG tablet Take 81 mg by mouth every other day.    . beclomethasone (QVAR) 40 MCG/ACT inhaler Inhale 1 puff into the lungs 2 (two) times daily.     . Calcium 500-100 MG-UNIT CHEW Chew 1 each by mouth daily.     . ergocalciferol (VITAMIN D2) 50000 UNITS capsule Take 50,000 Units by mouth. Taking 2 x weekly    . Fexofenadine HCl (ALLEGRA PO) Take 180 mg by mouth daily.     . fish oil-omega-3 fatty acids 1000 MG capsule Take 1 g by mouth daily.     . furosemide (LASIX) 40 MG tablet Take 40 mg by mouth. Takes about 4 times weekly    . losartan-hydrochlorothiazide (HYZAAR) 100-25 MG per tablet Take 1 tablet by mouth daily.    . mometasone (NASONEX) 50 MCG/ACT nasal spray Place 2 sprays into the nose daily.    . montelukast (SINGULAIR) 10 MG tablet Take 10 mg by mouth at bedtime.    . Multiple Vitamin (MULTIVITAMIN) capsule Take 1 capsule by mouth  daily.    . multivitamin-lutein (OCUVITE-LUTEIN) CAPS Take 1 capsule by mouth daily.    Marland Kitchen OVER THE COUNTER MEDICATION Take 1 capsule by mouth 2 (two) times daily. Carmine [employer] [employer] vitamin    . simvastatin (ZOCOR) 20 MG tablet Take 20 mg by mouth daily.    Marland Kitchen zinc gluconate 50 MG tablet Take 50 mg by mouth daily.    Marland Kitchen acetaminophen (TYLENOL) 650 MG CR tablet Take 650 mg by mouth 2 (two) times daily. Reported on 11/19/2015    . HYDROcodone-acetaminophen (NORCO) 5-325 MG per tablet Take 1 tablet by mouth every 6 (six) hours as needed for moderate pain. (Patient not taking: Reported on 05/16/2015) 30 tablet 0  . Ibuprofen (ADVIL PO) Take 200 mg by mouth. Reported on  11/19/2015    . Multiple Vitamins-Iron (CHLORELLA PO) Take by mouth 2 (two) times daily. Reported on 11/19/2015    . naproxen sodium (ALEVE) 220 MG tablet Take 220 mg by mouth 2 (two) times daily with a meal. Reported on 11/19/2015     No current facility-administered medications on file prior to visit.    Past Surgical History  Procedure Laterality Date  . Tonsillectomy    . Breast biopsy      right breast  . Breast lumpectomy  1982    right side  . History of hysteroscopy  04/16/14  . Carpal tunnel release Right 04/18/2015    Procedure: RIGHT CARPAL TUNNEL RELEASE;  Surgeon: Daryll Brod, MD;  Location: [facility];  Service: Orthopedics;  Laterality: Right;  REGIONAL/FAB    Denies any headaches, dizziness, double vision, fevers, chills, night sweats, nausea, vomiting, diarrhea, constipation, chest pain, heart palpitations, shortness of breath, blood in stool, black tarry stool, urinary pain, urinary burning, urinary frequency, hematuria.   PHYSICAL EXAMINATION  ECOG PERFORMANCE STATUS: 0 - Asymptomatic  Filed Vitals:   11/19/15 1310  BP: 129/68  Pulse: 66  Temp: 98 F (36.7 C)  Resp: 18    GENERAL:alert, well nourished, well developed, comfortable, cooperative, smiling and unaccompanied SKIN: skin color, texture, turgor are normal, no rashes or significant lesions HEAD: Normocephalic, No masses, lesions, tenderness or abnormalities EYES: normal, EOMI, Conjunctiva are pink and non-injected EARS: External ears normal OROPHARYNX:lips, buccal mucosa, and tongue normal  NECK: supple, trachea midline LYMPH:  not examined BREAST:not examined, as the patient needs to leave the clinic to get to her appointment in [place], [place]. LUNGS: clear to auscultation and percussion HEART: regular rate & rhythm, no murmurs, no gallops, S1 normal and S2 normal ABDOMEN:abdomen soft, non-tender, obese and normal bowel sounds BACK: Back symmetric, no curvature., No CVA  tenderness EXTREMITIES:less then 2 second capillary refill, no joint deformities, effusion, or inflammation, no edema, no skin discoloration, no clubbing, no cyanosis  NEURO: alert & oriented x 3 with fluent speech, no focal motor/sensory deficits, gait normal   LABORATORY DATA: CBC    Component Value Date/Time   WBC 5.5 10/24/2012 1247   RBC 4.12 10/24/2012 1247   HGB 12.8 04/18/2015 1116   HCT 38.2 10/24/2012 1247   PLT 244 10/24/2012 1247   MCV 92.7 10/24/2012 1247   MCH 31.1 10/24/2012 1247   MCHC 33.5 10/24/2012 1247   RDW 12.8 10/24/2012 1247   LYMPHSABS 1.1 10/24/2012 1247   MONOABS 0.6 10/24/2012 1247   EOSABS 0.1 10/24/2012 1247   BASOSABS 0.0 10/24/2012 1247      Chemistry      Component Value Date/Time   NA 139 04/12/2015 1122  K 3.3* 04/12/2015 1122   CL 99* 04/12/2015 1122   CO2 31 04/12/2015 1122   BUN 12 04/12/2015 1122   CREATININE 0.81 04/12/2015 1122      Component Value Date/Time   CALCIUM 9.1 04/12/2015 1122   ALKPHOS 52 10/24/2012 1247   AST 21 10/24/2012 1247   ALT 18 10/24/2012 1247   BILITOT 0.8 10/24/2012 1247        PENDING LABS:   RADIOGRAPHIC STUDIES:  No results found.   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  Started on anti-estrogen therapy with Tamoxifen beginning on 05/09/2012.  She has experienced two episodes of vaginal bleeding.  Her first episodes was in early 2016 and Gyn work-up was negative.  Her second episode is early 2017 requiring Gyn work-up again.  She is scheduled for a hysteroscopy in [place], [place] today, 11/19/2015 with a D&C on 11/26/2015.  THEREFORE, I am discontinuing her Tamoxifen today, 11/19/2015.  She experienced an episode of vaginal bleeding in early 2016.  This was evaluated by patient's gynecologist in [place] and she was advised to continue tamoxifen. Workup was negative.  Labs today: CBC diff, CMET  D/C  Tamoxifen secondary to recurrent vaginal bleeding.  Recommend continued symptom management of cough.  I have recommended OTC Delsym.    She will be due for her mammogram in June 2017.  She reports that she has her mammograms performed in [place] because "they have 3D mammography."  She is educated that [facility] also has 3D mammography and I have recommended she has her next mammogram locally given that the equipment is the same and radiologists are the same who read the exam.  She is appreciative of this information and will consider this.  Her only reservation is that her friend and her go together and enjoy going to the [employer] and a restaurant in [place] the day of their mammogram.  She will consider having her next mammogram at [facility].  I have volunteered to help her get this scheduled at [facility] and she will keep [place] informed if she is interested.  Return in 6 months for follow-up with labs: CBC diff, CMET.    THERAPY PLAN:  D/C Tamoxifen secondary to recurrent vaginal bleeding, requiring Gyn work-up.  All questions were answered. The patient knows to call the clinic with any problems, questions or concerns. We can certainly see the patient much sooner if necessary.  Patient and plan discussed with Dr. Ancil Linsey and she is in agreement with the aforementioned.   This note is electronically signed by: Doy Mince 11/19/2015 8:38 PM

## 2015-11-19 NOTE — Patient Instructions (Addendum)
[place] at [facility]  Discharge Instructions:  Due to recurrent vaginal bleeding, we will discontinue your Tamoxifen.  Tamoxifen has a risk of causing a secondary malignancy, particularly endometrial cancer.  As a result, we will discontinue Tamoxifen permanently at this time. Labs in 6 months. Return in 6 months for follow-up, sooner if necessary. Try Delsym for your cough. You are due for your next mammogram in June 2017.  Remember, [facility] has 3D mammogram since May 2016, also known as tomosynthesis.  Please let us know if you would like to have your 3D mammogram performed at [facility], I would be glad to order it for you and make sure it is scheduled. _______________________________________________________________  Thank you for choosing [place] at [facility] to provide your oncology and hematology care.  To afford each patient quality time with our providers, please arrive at least 15 minutes before your scheduled appointment.  You need to re-schedule your appointment if you arrive 10 or more minutes late.  We strive to give you quality time with our providers, and arriving late affects you and other patients whose appointments are after yours.  Also, if you no show three or more times for appointments you may be dismissed from the clinic.  Again, thank you for choosing [place] at [place] hope is that these requests will allow you access to exceptional care and in a timely manner. _______________________________________________________________  If you have questions after your visit, please contact our office at [phone] between the hours of 8:30 a.m. and 5:00 p.m. Voicemails left after 4:30 p.m. will not be returned until the following business day. _______________________________________________________________  For prescription refill requests, have your pharmacy contact our  office. _______________________________________________________________  Recommendations made by the consultant and any test results will be sent to your referring physician. _______________________________________________________________

## 2015-11-19 NOTE — Assessment & Plan Note (Addendum)
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  Started on anti-estrogen therapy with Tamoxifen beginning on 05/09/2012.  She has experienced two episodes of vaginal bleeding.  Her first episodes was in early 2016 and Gyn work-up was negative.  Her second episode is early 2017 requiring Gyn work-up again.  She is scheduled for a hysteroscopy in [place], [place] today, 11/19/2015 with a D&C on 11/26/2015.  THEREFORE, I am discontinuing her Tamoxifen today, 11/19/2015.  She experienced an episode of vaginal bleeding in early 2016.  This was evaluated by patient's gynecologist in [place] and she was advised to continue tamoxifen. Workup was negative.  Labs today: CBC diff, CMET  D/C Tamoxifen secondary to recurrent vaginal bleeding.  Recommend continued symptom management of cough.  I have recommended OTC Delsym.    She will be due for her mammogram in June 2017.  She reports that she has her mammograms performed in [place] because "they have 3D mammography."  She is educated that [facility] also has 3D mammography and I have recommended she has her next mammogram locally given that the equipment is the same and radiologists are the same who read the exam.  She is appreciative of this information and will consider this.  Her only reservation is that her friend and her go together and enjoy going to the [employer] and a restaurant in [place] the day of their mammogram.  She will consider having her next mammogram at [facility].  I have volunteered to help her get this scheduled at [facility] and she will keep [place] informed if she is interested.  Return in 6 months for follow-up with labs: CBC diff, CMET.

## 2015-12-30 DIAGNOSIS — N950 Postmenopausal bleeding: Secondary | ICD-10-CM | POA: Insufficient documentation

## 2016-01-24 DIAGNOSIS — I10 Essential (primary) hypertension: Secondary | ICD-10-CM | POA: Insufficient documentation

## 2016-01-24 DIAGNOSIS — E785 Hyperlipidemia, unspecified: Secondary | ICD-10-CM | POA: Insufficient documentation

## 2016-01-24 DIAGNOSIS — J449 Chronic obstructive pulmonary disease, unspecified: Secondary | ICD-10-CM | POA: Insufficient documentation

## 2016-03-11 DIAGNOSIS — D259 Leiomyoma of uterus, unspecified: Secondary | ICD-10-CM | POA: Insufficient documentation

## 2016-04-09 ENCOUNTER — Other Ambulatory Visit: Payer: Self-pay | Admitting: Surgery

## 2016-04-09 DIAGNOSIS — Z853 Personal history of malignant neoplasm of breast: Secondary | ICD-10-CM

## 2016-04-09 DIAGNOSIS — Z9889 Other specified postprocedural states: Secondary | ICD-10-CM

## 2016-05-05 ENCOUNTER — Ambulatory Visit
Admission: RE | Admit: 2016-05-05 | Discharge: 2016-05-05 | Disposition: A | Discharge status: Home / Self Care | Payer: Medicare Other | Source: Ambulatory Visit | Attending: Surgery | Admitting: Surgery

## 2016-05-05 DIAGNOSIS — Z853 Personal history of malignant neoplasm of breast: Secondary | ICD-10-CM

## 2016-05-05 DIAGNOSIS — Z9889 Other specified postprocedural states: Secondary | ICD-10-CM

## 2016-05-14 ENCOUNTER — Encounter (HOSPITAL_COMMUNITY): Payer: Medicare Other | Attending: Oncology | Admitting: Oncology

## 2016-05-14 ENCOUNTER — Encounter (HOSPITAL_COMMUNITY): Payer: Self-pay | Admitting: Oncology

## 2016-05-14 ENCOUNTER — Ambulatory Visit (HOSPITAL_COMMUNITY): Payer: Medicare Other | Admitting: Oncology

## 2016-05-14 DIAGNOSIS — D0511 Intraductal carcinoma in situ of right breast: Secondary | ICD-10-CM | POA: Diagnosis present

## 2016-05-14 NOTE — Progress Notes (Signed)
[place] [address] [address] [place] [address]  DCIS (ductal carcinoma in situ), right  CURRENT THERAPY: Tamoxifen discontinued on 11/19/2015 due to recurrent vaginal bleeding.  Surveillance per NCCN guidelines  INTERVAL HISTORY: Paula [name] 70 y.o. 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 Brantley Stage and XRT.  Started on anti-estrogen therapy with Tamoxifen beginning on 05/09/2012, but throughout the years, developed recurrent vaginal bleeding resulting in discontinuation of Tamoxifen on 11/19/2015.  She is doing well.  She denies any active complaints.  In April, she underwent TAH-BSO at [facility] for continued vaginal bleeding.  Pathology was benign.    Her aunt is living with her and she is 39 years old.  She recently suffered a stroke and now is located at an assisted living.  Review of Systems  Constitutional: Negative.  Negative for chills, fever and weight loss.  HENT: Negative.   Eyes: Negative.   Respiratory: Negative.   Cardiovascular: Negative.   Gastrointestinal: Negative.   Genitourinary: Negative.   Musculoskeletal: Negative.   Skin: Negative.   Neurological: Negative.  Negative for weakness.  Endo/Heme/Allergies: Negative.   Psychiatric/Behavioral: Negative.     Past Medical History:  Diagnosis Date  . Arthritis   . Breast cancer ([place])    right side DCIS  . Bronchitis   . Conjunctival disorder, other   . GERD (gastroesophageal reflux disease)   . Gilbert's syndrome   . Hyperlipidemia   . Hypertension   . Irregular heart beat   . Radiation    completed after 33 treatments  . Shortness of breath   . Vitamin D deficiency     Past Surgical History:  Procedure Laterality Date  . BREAST BIOPSY     right breast  . BREAST LUMPECTOMY  1982   right side  . CARPAL TUNNEL RELEASE Right 04/18/2015   Procedure: RIGHT CARPAL TUNNEL RELEASE;  Surgeon: Daryll Brod, MD;   Location: [facility];  Service: Orthopedics;  Laterality: Right;  REGIONAL/FAB  . History of hysteroscopy  04/16/14  . TONSILLECTOMY      Family History  Problem Relation Age of Onset  . Heart disease Father   . Heart disease Sister   . Heart disease Brother   . Cancer Maternal Aunt     unaware  . Heart disease Maternal Uncle     Social History   Social History  . Marital status: Divorced    Spouse name: N/A  . Number of children: N/A  . Years of education: N/A   Social History Main Topics  . Smoking status: Former Smoker    Quit date: 02/03/1991  . Smokeless tobacco: Never Used  . Alcohol use No  . Drug use: No  . Sexual activity: Not Asked   Other Topics Concern  . None   Social History Narrative  . None     PHYSICAL EXAMINATION  ECOG PERFORMANCE STATUS: 0 - Asymptomatic  Vitals:   05/14/16 1417  BP: (!) 144/66  Pulse: 82  Resp: 18  Temp: 98.7 F (37.1 C)    GENERAL:alert, no distress, well nourished, well developed, comfortable, cooperative, obese, smiling and unaccompanied SKIN: skin color, texture, turgor are normal, no rashes or significant lesions HEAD: Normocephalic, No masses, lesions, tenderness or abnormalities EYES: normal, EOMI, Conjunctiva are pink and non-injected EARS: External ears normal OROPHARYNX:lips, buccal mucosa, and tongue normal and mucous membranes are  moist  NECK: supple, no adenopathy, thyroid normal size, non-tender, without nodularity, trachea midline LYMPH:  no palpable lymphadenopathy BREAST:not examined LUNGS: clear to auscultation and percussion HEART: regular rate & rhythm, no murmurs, no gallops, S1 normal and S2 normal ABDOMEN:abdomen soft, non-tender and normal bowel sounds BACK: Back symmetric, no curvature. EXTREMITIES:less then 2 second capillary refill, no joint deformities, effusion, or inflammation, no skin discoloration, no cyanosis  NEURO: alert & oriented x 3 with fluent speech, no focal  motor/sensory deficits, gait normal   LABORATORY DATA: CBC    Component Value Date/Time   WBC 5.5 10/24/2012 1247   RBC 4.12 10/24/2012 1247   HGB 12.8 04/18/2015 1116   HCT 38.2 10/24/2012 1247   PLT 244 10/24/2012 1247   MCV 92.7 10/24/2012 1247   MCH 31.1 10/24/2012 1247   MCHC 33.5 10/24/2012 1247   RDW 12.8 10/24/2012 1247   LYMPHSABS 1.1 10/24/2012 1247   MONOABS 0.6 10/24/2012 1247   EOSABS 0.1 10/24/2012 1247   BASOSABS 0.0 10/24/2012 1247      Chemistry      Component Value Date/Time   NA 139 04/12/2015 1122   K 3.3 (L) 04/12/2015 1122   CL 99 (L) 04/12/2015 1122   CO2 31 04/12/2015 1122   BUN 12 04/12/2015 1122   CREATININE 0.81 04/12/2015 1122      Component Value Date/Time   CALCIUM 9.1 04/12/2015 1122   ALKPHOS 52 10/24/2012 1247   AST 21 10/24/2012 1247   ALT 18 10/24/2012 1247   BILITOT 0.8 10/24/2012 1247        PENDING LABS:   RADIOGRAPHIC STUDIES:  Mm Diag Breast Tomo Bilateral  Result Date: 05/05/2016 CLINICAL DATA:  History of right breast cancer in 2013 status post lumpectomy and radiation therapy. EXAM: 2D DIGITAL DIAGNOSTIC BILATERAL MAMMOGRAM WITH CAD AND ADJUNCT TOMO COMPARISON:  Previous exam(s). ACR Breast Density Category a: The breast tissue is almost entirely fatty. FINDINGS: There are stable postsurgical changes within the right breast. There are no new dominant masses, suspicious calcifications or secondary signs of malignancy within either breast. Mammographic images were processed with CAD. IMPRESSION: No evidence of malignancy within either breast. Stable postsurgical changes within the right breast. RECOMMENDATION: Bilateral diagnostic mammogram in 1 year. I have discussed the findings and recommendations with the patient. Results were also provided in writing at the conclusion of the visit. If applicable, a reminder letter will be sent to the patient regarding the next appointment. BI-RADS CATEGORY  2: Benign. Electronically  Signed   By: Franki Cabot M.D.   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 Brantley Stage and XRT.  Started on anti-estrogen therapy with Tamoxifen beginning on 05/09/2012, but throughout the years, developed recurrent vaginal bleeding resulting in discontinuation of Tamoxifen on 11/19/2015.  No role for labs today from an oncology perspective.  I personally reviewed and went over laboratory results with the patient.  The results are noted within this dictation.  I personally reviewed and went over radiographic studies with the patient.  The results are noted within this dictation.  Mammogram on 05/05/2016 is noted and was BIRADS 2.    Unbeknownst to me, the patient underwent TAH-BSO by Dr. Gershon Crane at [facility] on 01/29/2016 for recurrent vaginal bleeding.    Return in 6 months for follow-up and breast exam.  If all  is well in 6 months, we will consider seeing the patient on an annual basis with breast exam, which will alternative with her annual mammogram.   ORDERS PLACED FOR THIS ENCOUNTER: No orders of the defined types were placed in this encounter.   MEDICATIONS PRESCRIBED THIS ENCOUNTER: No orders of the defined types were placed in this encounter.   THERAPY PLAN:  NCCN guidelines recommends the following surveillance for DCIS of breast post-operatively (2.2017):  1. Interval history and physical exam every 6-12 months for 5 years, then annually.  2. Mammogram every 12 months (and 6-12 months postradiation therapy if breast conserved Category 2B).  3. If treated with Tamoxifen, monitor per NCCN guidelines for Breast Cancer Risk Reduction.  Risk reduction therapy for ipsilateral breast folling breast-conserving surgery (2.2017):  1. Consider endocrine therapy for 5 years for:   A. Patients treated with breast conserving therapy (lumpectomy) and  radiation therapy (category 1), especially for those with ER positive DCIS.   B. Benefit of endocrine therapy for ER negative DCIS is uncertain.   C. Patients treated with excision alone.  2. Endocrine therapy:   A. Tamoxifen for premenopausal patients.   B. Tamoxifen or aromatase inhibitor for postmenopausal patients with some advantage for aromatase inhibitor therapy in patients less than 74 years old or with concerns for thromboembolism  3. Risk reduction therapy for contralateral breast:   A. Counseling regarding risk reduction. See NCCN guidelines for breast cancer risk reduction.   All questions were answered. The patient knows to call the clinic with any problems, questions or concerns. We can certainly see the patient much sooner if necessary.  Patient and plan discussed with Dr. Ancil Linsey and she is in agreement with the aforementioned.   This note is electronically signed by: Doy Mince 05/14/2016 9:54 PM

## 2016-05-14 NOTE — Patient Instructions (Signed)
[place] at [facility] Discharge Instructions  RECOMMENDATIONS MADE BY THE CONSULTANT AND ANY TEST RESULTS WILL BE SENT TO YOUR REFERRING PHYSICIAN.   Exam done and seen today by Dr. Whitney Muse Mammogram for July 2018 Return to see the doctor in 6 months for follow up Please call the clinic if you have any questions or concerns  Thank you for choosing [place] at [facility] to provide your oncology and hematology care.  To afford each patient quality time with our provider, please arrive at least 15 minutes before your scheduled appointment time.   Beginning January 23rd 2017 lab work for the [facility] will be done in the  Main lab at [facility] on 1st floor. If you have a lab appointment with the [place] [facility] please come in thru the  Main Entrance and check in at the main information desk  You need to re-schedule your appointment should you arrive 10 or more minutes late.  We strive to give you quality time with our providers, and arriving late affects you and other patients whose appointments are after yours.  Also, if you no show three or more times for appointments you may be dismissed from the clinic at the providers discretion.     Again, thank you for choosing [facility].  Our hope is that these requests will decrease the amount of time that you wait before being seen by our physicians.       _____________________________________________________________  Should you have questions after your visit to [facility], please contact our office at [phone] between the hours of 8:30 a.m. and 4:30 p.m.  Voicemails left after 4:30 p.m. will not be returned until the following business day.  For prescription refill requests, have your pharmacy contact our office.         Resources For Cancer Patients and their Caregivers ? American Cancer Society: Can assist with transportation, wigs, general needs, runs  Look Good Feel Better.        ([phone] [phone] ? Cancer Care: Provides financial assistance, online support groups, medication/co-pay assistance.  1-800-813-HOPE 623-660-2290) ? [place] Assists [place] cancer patients and their families through emotional , educational and financial support.  205-008-7415 ? [place] DSS Where to apply for food stamps, Medicaid and utility assistance. (623)876-0769 ? RCATS: Transportation to medical appointments. ([phone] ? Social Security Administration: May apply for disability if have a Stage IV cancer. 786-328-5673 209-486-5173 ? [employer], Disability and Transit Services: Assists with nutrition, care and transit needs. [place] Support Programs: @10RELATIVEDAYS @ > Cancer Support Group  2nd Tuesday of the month 1pm-2pm, Journey Room  > Creative Journey  3rd Tuesday of the month 1130am-1pm, Journey Room  > Look Good Feel Better  1st Wednesday of the month 10am-12 noon, Journey Room (Call [place] to register [phone])

## 2016-05-14 NOTE — Assessment & Plan Note (Addendum)
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 Brantley Stage and XRT.  Started on anti-estrogen therapy with Tamoxifen beginning on 05/09/2012, but throughout the years, developed recurrent vaginal bleeding resulting in discontinuation of Tamoxifen on 11/19/2015.  No role for labs today from an oncology perspective.  I personally reviewed and went over laboratory results with the patient.  The results are noted within this dictation.  I personally reviewed and went over radiographic studies with the patient.  The results are noted within this dictation.  Mammogram on 05/05/2016 is noted and was BIRADS 2.    Unbeknownst to me, the patient underwent TAH-BSO by Dr. Gershon Crane at [facility] on 01/29/2016 for recurrent vaginal bleeding.    Return in 6 months for follow-up and breast exam.  If all is well in 6 months, we will consider seeing the patient on an annual basis with breast exam, which will alternative with her annual mammogram.

## 2016-05-18 ENCOUNTER — Ambulatory Visit (HOSPITAL_COMMUNITY): Payer: Medicare Other | Admitting: Oncology

## 2016-11-19 ENCOUNTER — Encounter (HOSPITAL_COMMUNITY): Payer: Medicare Other | Attending: Adult Health | Admitting: Oncology

## 2016-11-19 ENCOUNTER — Encounter (HOSPITAL_COMMUNITY): Payer: Self-pay | Admitting: Oncology

## 2016-11-19 DIAGNOSIS — D0511 Intraductal carcinoma in situ of right breast: Secondary | ICD-10-CM | POA: Diagnosis present

## 2016-11-19 DIAGNOSIS — Z170 Estrogen receptor positive status [ER+]: Secondary | ICD-10-CM | POA: Diagnosis not present

## 2016-11-19 NOTE — Assessment & Plan Note (Addendum)
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 Brantley Stage and XRT.  Started on anti-estrogen therapy with Tamoxifen beginning on 05/09/2012, but throughout the years, developed recurrent vaginal bleeding resulting in discontinuation of Tamoxifen on 11/19/2015.  No role for labs today from an oncology perspective.  I personally reviewed and went over laboratory results with the patient.  The results are noted within this dictation.  I personally reviewed and went over radiographic studies with the patient.  The results are noted within this dictation.  Mammogram on 05/05/2016 is noted and was BIRADS 2.  She will be due for mammogram in July 2018.  Order is placed for mammogram in July 2018.  Unbeknownst to me, the patient underwent TAH-BSO by Dr. Gershon Crane at [facility] on 01/29/2016 for recurrent vaginal bleeding.    Return in 12 months for follow-up and breast exam.

## 2016-11-19 NOTE — Patient Instructions (Signed)
[place] at [facility] Discharge Instructions  RECOMMENDATIONS MADE BY THE CONSULTANT AND ANY TEST RESULTS WILL BE SENT TO YOUR REFERRING PHYSICIAN.  Exam with Robynn Pane, PA. We will see you for follow up in 12 months. You are due for a mammogram in July.   Please see Amy as you leave for appointments.  Thank you for choosing [place] at [facility] to provide your oncology and hematology care.  To afford each patient quality time with our provider, please arrive at least 15 minutes before your scheduled appointment time.    If you have a lab appointment with the [place] please come in thru the  Main Entrance and check in at the main information desk  You need to re-schedule your appointment should you arrive 10 or more minutes late.  We strive to give you quality time with our providers, and arriving late affects you and other patients whose appointments are after yours.  Also, if you no show three or more times for appointments you may be dismissed from the clinic at the providers discretion.     Again, thank you for choosing [facility].  Our hope is that these requests will decrease the amount of time that you wait before being seen by our physicians.       _____________________________________________________________  Should you have questions after your visit to [facility], please contact our office at [phone] between the hours of 8:30 a.m. and 4:30 p.m.  Voicemails left after 4:30 p.m. will not be returned until the following business day.  For prescription refill requests, have your pharmacy contact our office.       Resources For Cancer Patients and their Caregivers ? American Cancer Society: Can assist with transportation, wigs, general needs, runs Look Good Feel Better.        [phone] ? Cancer Care: Provides financial assistance, online support groups, medication/co-pay  assistance.  1-800-813-HOPE 306-840-7621) ? [place] Assists [place] cancer patients and their families through emotional , educational and financial support.  [phone] ? [place] DSS Where to apply for food stamps, Medicaid and utility assistance. 470-200-4148 ? RCATS: Transportation to medical appointments. 339-568-8377 ? Social Security Administration: May apply for disability if have a Stage IV cancer. 705-162-9097 [phone] ? [employer], Disability and Transit Services: Assists with nutrition, care and transit needs. [place] Support Programs: @10RELATIVEDAYS @ > Cancer Support Group  2nd Tuesday of the month 1pm-2pm, Journey Room  > Creative Journey  3rd Tuesday of the month 1130am-1pm, Journey Room  > Look Good Feel Better  1st Wednesday of the month 10am-12 noon, Journey Room (Call [place] to register [phone])

## 2016-11-19 NOTE — Progress Notes (Signed)
[place] [address] [address] [place] [address]  Ductal carcinoma in situ (DCIS) of right breast - Plan: gabapentin (NEURONTIN) 300 MG capsule, fluticasone (FLONASE) 50 MCG/ACT nasal spray, MM SCREENING BREAST TOMO BILATERAL  CURRENT THERAPY: Tamoxifen discontinued on 11/19/2015 due to recurrent vaginal bleeding.  Surveillance per NCCN guidelines  INTERVAL HISTORY: Paula [name] 71 y.o. 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 Brantley Stage and XRT.  Started on anti-estrogen therapy with Tamoxifen beginning on 05/09/2012, but throughout the years, developed recurrent vaginal bleeding resulting in discontinuation of Tamoxifen on 11/19/2015.  She is doing well.  She denies any active complaints.  In April, she underwent TAH-BSO at [facility] for continued vaginal bleeding.  Pathology was benign.    Her aunt passed away is 65 years old.  She recently suffered a stroke and now is located at an assisted living.  She denies any breast complaints or new findings. She is comfortable in her decision to discontinue tamoxifen for recurrent vaginal bleeding. She is up-to-date on mammography and she is due in July 2018.  Review of Systems  Constitutional: Negative.  Negative for chills, fever and weight loss.  HENT: Negative.   Eyes: Negative.   Respiratory: Negative.   Cardiovascular: Negative.   Gastrointestinal: Negative.   Genitourinary: Negative.   Musculoskeletal: Negative.   Skin: Negative.   Neurological: Negative.  Negative for weakness.  Endo/Heme/Allergies: Negative.   Psychiatric/Behavioral: Negative.     Past Medical History:  Diagnosis Date  . Arthritis   . Breast cancer ([place])    right side DCIS  . Bronchitis   . Conjunctival disorder, other   . GERD (gastroesophageal reflux disease)   . Gilbert's syndrome   . Hyperlipidemia   . Hypertension   . Irregular heart beat   . Radiation    completed after 33 treatments  . Shortness of breath   . Vitamin D deficiency     Past Surgical History:  Procedure Laterality Date  . BREAST BIOPSY     right breast  . BREAST LUMPECTOMY  1982   right side  . CARPAL TUNNEL RELEASE Right 04/18/2015   Procedure: RIGHT CARPAL TUNNEL RELEASE;  Surgeon: Daryll Brod, MD;  Location: [facility];  Service: Orthopedics;  Laterality: Right;  REGIONAL/FAB  . History of hysteroscopy  04/16/14  . TONSILLECTOMY      Family History  Problem Relation Age of Onset  . Heart disease Father   . Heart disease Sister   . Heart disease Brother   . Cancer Maternal Aunt     unaware  . Heart disease Maternal Uncle     Social History   Social History  . Marital status: Divorced    Spouse name: N/A  . Number of children: N/A  . Years of education: N/A   Social History Main Topics  . Smoking status: Former Smoker    Quit date: 02/03/1991  . Smokeless tobacco: Never Used  . Alcohol use No  . Drug use: No  . Sexual activity: Not Asked   Other Topics Concern  . None   Social History Narrative  . None     PHYSICAL EXAMINATION  ECOG PERFORMANCE STATUS: 0 - Asymptomatic  Vitals:   11/19/16 1108  BP: (!) 141/69  Pulse: (!) 58  Resp: 18  Temp: 97.7 F (36.5 C)    GENERAL:alert, no distress, well nourished,  well developed, comfortable, cooperative, obese, smiling and unaccompanied SKIN: skin color, texture, turgor are normal, no rashes or significant lesions HEAD: Normocephalic, No masses, lesions, tenderness or abnormalities EYES: normal, EOMI, Conjunctiva are pink and non-injected EARS: External ears normal OROPHARYNX:lips, buccal mucosa, and tongue normal and mucous membranes are moist  NECK: supple, no adenopathy, thyroid normal size, non-tender, without nodularity, trachea midline LYMPH:  no palpable lymphadenopathy BREAST: S/P right lumpectomy with surgical site well healed without any masses, nodules, or skins changes.   Right breast without any palpable masses or lesions, no nipple inversion, no skin changes. LUNGS: clear to auscultation and percussion HEART: regular rate & rhythm, no murmurs, no gallops, S1 normal and S2 normal ABDOMEN:abdomen soft, non-tender and normal bowel sounds BACK: Back symmetric, no curvature. EXTREMITIES:less then 2 second capillary refill, no joint deformities, effusion, or inflammation, no skin discoloration, no cyanosis  NEURO: alert & oriented x 3 with fluent speech, no focal motor/sensory deficits, gait normal   LABORATORY DATA: CBC    Component Value Date/Time   WBC 5.5 10/24/2012 1247   RBC 4.12 10/24/2012 1247   HGB 12.8 04/18/2015 1116   HCT 38.2 10/24/2012 1247   PLT 244 10/24/2012 1247   MCV 92.7 10/24/2012 1247   MCH 31.1 10/24/2012 1247   MCHC 33.5 10/24/2012 1247   RDW 12.8 10/24/2012 1247   LYMPHSABS 1.1 10/24/2012 1247   MONOABS 0.6 10/24/2012 1247   EOSABS 0.1 10/24/2012 1247   BASOSABS 0.0 10/24/2012 1247      Chemistry      Component Value Date/Time   NA 139 04/12/2015 1122   K 3.3 (L) 04/12/2015 1122   CL 99 (L) 04/12/2015 1122   CO2 31 04/12/2015 1122   BUN 12 04/12/2015 1122   CREATININE 0.81 04/12/2015 1122      Component Value Date/Time   CALCIUM 9.1 04/12/2015 1122   ALKPHOS 52 10/24/2012 1247   AST 21 10/24/2012 1247   ALT 18 10/24/2012 1247   BILITOT 0.8 10/24/2012 1247        PENDING LABS:   RADIOGRAPHIC STUDIES:  No results found.   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 Brantley Stage and XRT.  Started on anti-estrogen therapy with Tamoxifen beginning on 05/09/2012, but throughout the years, developed recurrent vaginal bleeding resulting in discontinuation of Tamoxifen on 11/19/2015.  No role for labs today from an oncology perspective.  I personally reviewed and went over laboratory results with the patient.  The  results are noted within this dictation.  I personally reviewed and went over radiographic studies with the patient.  The results are noted within this dictation.  Mammogram on 05/05/2016 is noted and was BIRADS 2.  She will be due for mammogram in July 2018.  Order is placed for mammogram in July 2018.  Unbeknownst to me, the patient underwent TAH-BSO by Dr. Gershon Crane at [facility] on 01/29/2016 for recurrent vaginal bleeding.    Return in 12 months for follow-up and breast exam.     ORDERS PLACED FOR THIS ENCOUNTER: Orders Placed This Encounter  Procedures  . MM SCREENING BREAST TOMO BILATERAL    MEDICATIONS PRESCRIBED THIS ENCOUNTER: Meds ordered this encounter  Medications  . gabapentin (NEURONTIN) 300 MG capsule  . fluticasone (FLONASE) 50 MCG/ACT nasal spray    Sig: Place 1 spray into both nostrils daily.    THERAPY PLAN:  NCCN guidelines recommends the following surveillance for DCIS of breast post-operatively (2.2017):  1. Interval history and physical exam every 6-12 months for 5 years, then annually.  2. Mammogram every 12 months (and 6-12 months postradiation therapy if breast conserved Category 2B).  3. If treated with Tamoxifen, monitor per NCCN guidelines for Breast Cancer Risk Reduction.  All questions were answered. The patient knows to call the clinic with any problems, questions or concerns. We can certainly see the patient much sooner if necessary.  Patient and plan discussed with Dr. Twana First and she is in agreement with the aforementioned.   This note is electronically signed by: Doy Mince 11/19/2016 12:02 PM

## 2016-11-20 ENCOUNTER — Ambulatory Visit (HOSPITAL_COMMUNITY): Payer: Medicare Other | Admitting: Hematology & Oncology

## 2017-04-19 ENCOUNTER — Other Ambulatory Visit (HOSPITAL_COMMUNITY): Payer: Self-pay | Admitting: Oncology

## 2017-04-19 DIAGNOSIS — Z9889 Other specified postprocedural states: Secondary | ICD-10-CM

## 2017-04-19 DIAGNOSIS — D0511 Intraductal carcinoma in situ of right breast: Secondary | ICD-10-CM

## 2017-04-19 DIAGNOSIS — Z853 Personal history of malignant neoplasm of breast: Secondary | ICD-10-CM

## 2017-05-12 ENCOUNTER — Ambulatory Visit (HOSPITAL_COMMUNITY): Payer: Medicare Other

## 2017-12-14 ENCOUNTER — Inpatient Hospital Stay (HOSPITAL_COMMUNITY): Payer: Medicare Other | Attending: Internal Medicine | Admitting: Internal Medicine

## 2017-12-14 ENCOUNTER — Other Ambulatory Visit: Payer: Self-pay

## 2017-12-14 ENCOUNTER — Encounter (HOSPITAL_COMMUNITY): Payer: Self-pay | Admitting: Internal Medicine

## 2017-12-14 VITALS — BP 154/68 | HR 84 | Temp 97.5°F | Resp 16 | Wt 188.7 lb

## 2017-12-14 DIAGNOSIS — Z923 Personal history of irradiation: Secondary | ICD-10-CM

## 2017-12-14 DIAGNOSIS — D0511 Intraductal carcinoma in situ of right breast: Secondary | ICD-10-CM

## 2017-12-14 DIAGNOSIS — Z7982 Long term (current) use of aspirin: Secondary | ICD-10-CM | POA: Diagnosis not present

## 2017-12-14 DIAGNOSIS — Z9223 Personal history of estrogen therapy: Secondary | ICD-10-CM | POA: Diagnosis not present

## 2017-12-14 DIAGNOSIS — Z170 Estrogen receptor positive status [ER+]: Secondary | ICD-10-CM

## 2017-12-14 DIAGNOSIS — D259 Leiomyoma of uterus, unspecified: Secondary | ICD-10-CM | POA: Diagnosis not present

## 2017-12-14 DIAGNOSIS — I10 Essential (primary) hypertension: Secondary | ICD-10-CM

## 2017-12-14 DIAGNOSIS — Z86000 Personal history of in-situ neoplasm of breast: Secondary | ICD-10-CM | POA: Diagnosis not present

## 2017-12-14 DIAGNOSIS — Z79899 Other long term (current) drug therapy: Secondary | ICD-10-CM | POA: Diagnosis not present

## 2017-12-14 DIAGNOSIS — E785 Hyperlipidemia, unspecified: Secondary | ICD-10-CM

## 2017-12-14 DIAGNOSIS — E559 Vitamin D deficiency, unspecified: Secondary | ICD-10-CM | POA: Diagnosis not present

## 2017-12-14 DIAGNOSIS — J449 Chronic obstructive pulmonary disease, unspecified: Secondary | ICD-10-CM

## 2017-12-14 DIAGNOSIS — K219 Gastro-esophageal reflux disease without esophagitis: Secondary | ICD-10-CM | POA: Diagnosis not present

## 2017-12-14 DIAGNOSIS — Z87891 Personal history of nicotine dependence: Secondary | ICD-10-CM | POA: Diagnosis not present

## 2017-12-14 NOTE — Patient Instructions (Addendum)
[place] at [facility] Discharge Instructions   You were seen today by Dr. Zoila Shutter. Reviewed your recent mammogram. Dr. Walden Field would like for you to have bilateral diagnostic mammograms done within the [facility] system. Breast exam done today. Return in 1 year for follow up.     Thank you for choosing [place] at [facility] to provide your oncology and hematology care.  To afford each patient quality time with our provider, please arrive at least 15 minutes before your scheduled appointment time.    If you have a lab appointment with the [place] please come in thru the  Main Entrance and check in at the main information desk  You need to re-schedule your appointment should you arrive 10 or more minutes late.  We strive to give you quality time with our providers, and arriving late affects you and other patients whose appointments are after yours.  Also, if you no show three or more times for appointments you may be dismissed from the clinic at the providers discretion.     Again, thank you for choosing [facility].  Our hope is that these requests will decrease the amount of time that you wait before being seen by our physicians.       _____________________________________________________________  Should you have questions after your visit to [facility], please contact our office at [phone] between the hours of 8:30 a.m. and 4:30 p.m.  Voicemails left after 4:30 p.m. will not be returned until the following business day.  For prescription refill requests, have your pharmacy contact our office.       Resources For Cancer Patients and their Caregivers ? American Cancer Society: Can assist with transportation, wigs, general needs, runs Look Good Feel Better.        [phone] ? Cancer Care: Provides financial assistance, online support groups, medication/co-pay assistance.  1-800-813-HOPE  (307)433-5094) ? [place] Assists [place] cancer patients and their families through emotional , educational and financial support.  (989) [phone] ? [place] DSS Where to apply for food stamps, Medicaid and utility assistance. 8595850703 ? RCATS: Transportation to medical appointments. 984-595-4957 ? Social Security Administration: May apply for disability if have a Stage IV cancer. 407-601-4113 385-003-8327 ? [employer], Disability and Transit Services: Assists with nutrition, care and transit needs. [place] Support Programs:   > Cancer Support Group  2nd Tuesday of the month 1pm-2pm, Journey Room   > Creative Journey  3rd Tuesday of the month 1130am-1pm, Journey Room

## 2017-12-30 ENCOUNTER — Other Ambulatory Visit: Payer: Self-pay | Admitting: Obstetrics and Gynecology

## 2017-12-30 DIAGNOSIS — R928 Other abnormal and inconclusive findings on diagnostic imaging of breast: Secondary | ICD-10-CM

## 2018-01-07 NOTE — Progress Notes (Signed)
Diagnosis Ductal carcinoma in situ (DCIS) of right breast - Plan: MM Digital Diagnostic Bilat, CBC with Differential/Platelet, Comprehensive metabolic panel, Lactate dehydrogenase  Staging Cancer Staging DCIS (ductal carcinoma in situ) Staging form: Breast, AJCC 7th Edition - Pathologic: Tis, N0, cM0 - Unsigned - Clinical stage from 11/19/2015: Stage 0 (Tis, N0, cM0) - Signed by Baird Cancer, PA-C on 11/19/2015   Assessment and Plan:  1. Stage 0.   DCIS (ductal carcinoma in situ).  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 Brantley Stage and XRT.  Started on anti-estrogen therapy with Tamoxifen beginning on 05/09/2012, but throughout the years, developed recurrent vaginal bleeding resulting in discontinuation of Tamoxifen on 11/19/2015.   Pt is set up for bilateral diagnostic mammogram in 12/2017 and is recommended to have the study in the [facility].  Pending mammogram results she will be given follow-up in 1 year.    2.  Vaginal bleeding.  This led to Tamoxifen being discontinued in 2017.  The patient reportedly underwent TAH-BSO by Dr. Gershon Crane at [facility] on 01/29/2016 for recurrent vaginal bleeding.    3.  Hypertension.  BP is 154/68.  Continue to follow-up with PCP.    Current Status:  Pt is seen today for follow-up.    Problem List Patient Active Problem List   Diagnosis Date Noted  . Leiomyoma of body of uterus [D25.9] 03/11/2016  . COPD (chronic obstructive pulmonary disease) ([place]) [J44.9] 01/24/2016  . HLD (hyperlipidemia) [E78.5] 01/24/2016  . HTN (hypertension) [I10] 01/24/2016  . PMB (postmenopausal bleeding) [N95.0] 12/30/2015  . Vaginal bleeding [N93.9] 11/16/2014  . Allergic rhinitis [J30.9] 11/03/2013  . Vitamin D deficiency [E55.9] 11/03/2013  . History of breast cancer [Z85.3] 02/13/2013  . DCIS (ductal carcinoma in situ) [D05.10] 01/04/2012    Past Medical History Past Medical History:  Diagnosis Date  . Arthritis    . Breast cancer ([place])    right side DCIS  . Bronchitis   . Conjunctival disorder, other   . GERD (gastroesophageal reflux disease)   . Gilbert's syndrome   . Hyperlipidemia   . Hypertension   . Irregular heart beat   . Radiation    completed after 33 treatments  . Shortness of breath   . Vitamin D deficiency     Past Surgical History Past Surgical History:  Procedure Laterality Date  . BREAST BIOPSY     right breast  . BREAST LUMPECTOMY  1982   right side  . CARPAL TUNNEL RELEASE Right 04/18/2015   Procedure: RIGHT CARPAL TUNNEL RELEASE;  Surgeon: Daryll Brod, MD;  Location: [facility];  Service: Orthopedics;  Laterality: Right;  REGIONAL/FAB  . History of hysteroscopy  04/16/14  . TONSILLECTOMY      Family History Family History  Problem Relation Age of Onset  . Heart disease Father   . Heart disease Sister   . Heart disease Brother   . Cancer Maternal Aunt        unaware  . Heart disease Maternal Uncle      Social History  reports that she quit smoking about 26 years ago. She has never used smokeless tobacco. She reports that she does not drink alcohol or use drugs.  Medications  Current Outpatient Medications:  .  albuterol (PROVENTIL HFA;VENTOLIN HFA) 108 (90 BASE) MCG/ACT inhaler, Inhale 2 puffs into the lungs 2 (two) times daily. , Disp: , Rfl:  .  aspirin EC 81 MG tablet, Take 81  mg by mouth every other day., Disp: , Rfl:  .  beclomethasone (QVAR) 40 MCG/ACT inhaler, Inhale 1 puff into the lungs 2 (two) times daily. , Disp: , Rfl:  .  Calcium 500-100 MG-UNIT CHEW, Chew 1 each by mouth daily. , Disp: , Rfl:  .  Fexofenadine HCl (ALLEGRA PO), Take 180 mg by mouth daily. , Disp: , Rfl:  .  fish oil-omega-3 fatty acids 1000 MG capsule, Take 1 g by mouth daily. , Disp: , Rfl:  .  fluticasone (FLONASE) 50 MCG/ACT nasal spray, Place 1 spray into both nostrils daily., Disp: , Rfl:  .  furosemide (LASIX) 40 MG tablet, Take 1 tablet by mouth 3 (three)  times a week., Disp: , Rfl:  .  gabapentin (NEURONTIN) 300 MG capsule, , Disp: , Rfl:  .  losartan-hydrochlorothiazide (HYZAAR) 100-25 MG per tablet, Take 1 tablet by mouth daily., Disp: , Rfl:  .  montelukast (SINGULAIR) 10 MG tablet, Take 10 mg by mouth at bedtime., Disp: , Rfl:  .  Multiple Vitamin (MULTIVITAMIN) capsule, Take 1 capsule by mouth daily., Disp: , Rfl:  .  naproxen sodium (ALEVE) 220 MG tablet, Take 220 mg by mouth 3 (three) times daily as needed., Disp: , Rfl:  .  Potassium 99 MG TABS, Take 1 tablet by mouth daily., Disp: , Rfl:  .  simvastatin (ZOCOR) 20 MG tablet, Take 20 mg by mouth daily., Disp: , Rfl:  .  terbinafine (LAMISIL) 250 MG tablet, Take 1 tablet by mouth daily., Disp: , Rfl:  .  zinc gluconate 50 MG tablet, Take 50 mg by mouth daily., Disp: , Rfl:   Allergies Amoxicillin; Ceclor [cefaclor]; Doxycycline; and Penicillins  Review of Systems Review of Systems - Oncology ROS as per HPI otherwise 12 point ROS is negative.   Physical Exam  Vitals Wt Readings from Last 3 Encounters:  12/14/17 188 lb 11.2 oz (85.6 kg)  11/19/16 177 lb 12.8 oz (80.6 kg)  05/14/16 182 lb (82.6 kg)   Temp Readings from Last 3 Encounters:  12/14/17 (!) 97.5 F (36.4 C) (Oral)  11/19/16 97.7 F (36.5 C) (Oral)  05/14/16 98.7 F (37.1 C) (Oral)   BP Readings from Last 3 Encounters:  12/14/17 (!) 154/68  11/19/16 (!) 141/69  05/14/16 (!) 144/66   Pulse Readings from Last 3 Encounters:  12/14/17 84  11/19/16 (!) 58  05/14/16 82   Constitutional: Well-developed, well-nourished, and in no distress.   HENT: Head: Normocephalic and atraumatic.  Mouth/Throat: No oropharyngeal exudate. Mucosa moist. Eyes: Pupils are equal, round, and reactive to light. Conjunctivae are normal. No scleral icterus.  Neck: Normal range of motion. Neck supple. No JVD present.  Cardiovascular: Normal rate, regular rhythm and normal heart sounds.  Exam reveals no gallop and no friction rub.   No  murmur heard. Pulmonary/Chest: Effort normal and breath sounds normal. No respiratory distress. No wheezes.No rales.  Abdominal: Soft. Bowel sounds are normal. No distension. There is no tenderness. There is no guarding.  Musculoskeletal: No edema or tenderness.  Lymphadenopathy: No cervical, axillary  or supraclavicular adenopathy.  Neurological: Alert and oriented to person, place, and time. No cranial nerve deficit.  Skin: Skin is warm and dry. No rash noted. No erythema. No pallor.  Psychiatric: Affect and judgment normal.  Breast exam:  Right lumpectomy healed well.  No dominant masses noted bilaterally.    Labs No visits with results within 3 Day(s) from this visit.  Latest known visit with results is:  Admission   on 04/18/2015, Discharged on 04/18/2015  Component Date Value Ref Range Status  . Sodium 04/12/2015 139  135 - 145 mmol/L Final  . Potassium 04/12/2015 3.3* 3.5 - 5.1 mmol/L Final  . Chloride 04/12/2015 99* 101 - 111 mmol/L Final  . CO2 04/12/2015 31  22 - 32 mmol/L Final  . Glucose, Bld 04/12/2015 102* 65 - 99 mg/dL Final  . BUN 04/12/2015 12  6 - 20 mg/dL Final  . Creatinine, Ser 04/12/2015 0.81  0.44 - 1.00 mg/dL Final  . Calcium 04/12/2015 9.1  8.9 - 10.3 mg/dL Final  . GFR calc non Af Amer 04/12/2015 >60  >60 mL/min Final  . GFR calc Af Amer 04/12/2015 >60  >60 mL/min Final   Comment: (NOTE) The eGFR has been calculated using the CKD EPI equation. This calculation has not been validated in all clinical situations. eGFR's persistently <60 mL/min signify possible Chronic Kidney Disease.   . Anion gap 04/12/2015 9  5 - 15 Final  . Hemoglobin 04/18/2015 12.8  12.0 - 15.0 g/dL Final     Pathology Orders Placed This Encounter  Procedures  . MM Digital Diagnostic Bilat    Standing Status:   Future    Standing Expiration Date:   12/14/2018    Order Specific Question:   Reason for Exam (SYMPTOM  OR DIAGNOSIS REQUIRED)    Answer:   right breast DCIS.  Pt had outside  screening mammogram done at [facility] that showed new calcifications in right breast    Order Specific Question:   Preferred imaging location?    Answer:   [facility]  . CBC with Differential/Platelet    Standing Status:   Future    Standing Expiration Date:   12/15/2018  . Comprehensive metabolic panel    Standing Status:   Future    Standing Expiration Date:   12/15/2018  . Lactate dehydrogenase    Standing Status:   Future    Standing Expiration Date:   12/15/2018       Vetta Higgs, MD 

## 2018-01-18 ENCOUNTER — Other Ambulatory Visit (HOSPITAL_COMMUNITY): Payer: Self-pay | Admitting: Oncology

## 2018-01-18 ENCOUNTER — Other Ambulatory Visit (HOSPITAL_COMMUNITY): Payer: Self-pay | Admitting: Internal Medicine

## 2018-01-18 DIAGNOSIS — D0511 Intraductal carcinoma in situ of right breast: Secondary | ICD-10-CM

## 2018-01-18 DIAGNOSIS — Z9889 Other specified postprocedural states: Secondary | ICD-10-CM

## 2018-01-18 DIAGNOSIS — Z853 Personal history of malignant neoplasm of breast: Secondary | ICD-10-CM

## 2018-01-18 DIAGNOSIS — R921 Mammographic calcification found on diagnostic imaging of breast: Secondary | ICD-10-CM

## 2018-01-20 ENCOUNTER — Other Ambulatory Visit (HOSPITAL_COMMUNITY): Payer: Self-pay | Admitting: Internal Medicine

## 2018-01-20 ENCOUNTER — Ambulatory Visit
Admission: RE | Admit: 2018-01-20 | Discharge: 2018-01-20 | Disposition: A | Discharge status: Home / Self Care | Payer: Medicare Other | Source: Ambulatory Visit | Attending: Internal Medicine | Admitting: Internal Medicine

## 2018-01-20 DIAGNOSIS — R921 Mammographic calcification found on diagnostic imaging of breast: Secondary | ICD-10-CM

## 2018-01-20 HISTORY — DX: Personal history of irradiation: Z92.3

## 2018-01-20 HISTORY — DX: Intraductal carcinoma in situ of unspecified breast: D05.10

## 2018-08-09 ENCOUNTER — Ambulatory Visit
Admission: RE | Admit: 2018-08-09 | Discharge: 2018-08-09 | Disposition: A | Discharge status: Home / Self Care | Payer: Medicare Other | Source: Ambulatory Visit | Attending: Internal Medicine | Admitting: Internal Medicine

## 2018-08-09 DIAGNOSIS — R921 Mammographic calcification found on diagnostic imaging of breast: Secondary | ICD-10-CM

## 2018-10-17 ENCOUNTER — Other Ambulatory Visit: Payer: Self-pay | Admitting: Surgery

## 2018-10-17 DIAGNOSIS — Z1231 Encounter for screening mammogram for malignant neoplasm of breast: Secondary | ICD-10-CM

## 2018-12-01 ENCOUNTER — Ambulatory Visit
Admission: RE | Admit: 2018-12-01 | Discharge: 2018-12-01 | Disposition: A | Discharge status: Home / Self Care | Payer: Medicare Other | Source: Ambulatory Visit | Attending: Surgery | Admitting: Surgery

## 2018-12-01 DIAGNOSIS — Z1231 Encounter for screening mammogram for malignant neoplasm of breast: Secondary | ICD-10-CM

## 2018-12-21 ENCOUNTER — Ambulatory Visit (HOSPITAL_COMMUNITY): Payer: Medicare Other | Admitting: Internal Medicine

## 2018-12-21 ENCOUNTER — Ambulatory Visit (HOSPITAL_COMMUNITY): Payer: Medicare Other | Admitting: Hematology

## 2018-12-21 ENCOUNTER — Ambulatory Visit (HOSPITAL_COMMUNITY): Payer: Medicare Other | Admitting: Adult Health

## 2019-12-18 ENCOUNTER — Other Ambulatory Visit: Payer: Self-pay | Admitting: Surgery

## 2019-12-18 DIAGNOSIS — Z1231 Encounter for screening mammogram for malignant neoplasm of breast: Secondary | ICD-10-CM

## 2020-01-16 ENCOUNTER — Ambulatory Visit
Admission: RE | Admit: 2020-01-16 | Discharge: 2020-01-16 | Disposition: A | Discharge status: Home / Self Care | Payer: Medicare Other | Source: Ambulatory Visit | Attending: Surgery | Admitting: Surgery

## 2020-01-16 ENCOUNTER — Other Ambulatory Visit: Payer: Self-pay

## 2020-01-16 DIAGNOSIS — Z1231 Encounter for screening mammogram for malignant neoplasm of breast: Secondary | ICD-10-CM

## 2021-03-14 ENCOUNTER — Other Ambulatory Visit: Payer: Self-pay | Admitting: Surgery

## 2021-03-14 DIAGNOSIS — Z1231 Encounter for screening mammogram for malignant neoplasm of breast: Secondary | ICD-10-CM

## 2021-03-18 ENCOUNTER — Other Ambulatory Visit: Payer: Self-pay

## 2021-03-18 ENCOUNTER — Ambulatory Visit
Admission: RE | Admit: 2021-03-18 | Discharge: 2021-03-18 | Disposition: A | Discharge status: Home / Self Care | Payer: Medicare Other | Source: Ambulatory Visit | Attending: Surgery | Admitting: Surgery

## 2021-03-18 DIAGNOSIS — Z1231 Encounter for screening mammogram for malignant neoplasm of breast: Secondary | ICD-10-CM

## 2021-05-15 ENCOUNTER — Encounter: Payer: Self-pay | Admitting: Plastic Surgery

## 2021-05-15 ENCOUNTER — Other Ambulatory Visit: Payer: Self-pay

## 2021-05-15 ENCOUNTER — Ambulatory Visit (INDEPENDENT_AMBULATORY_CARE_PROVIDER_SITE_OTHER): Payer: Medicare Other | Admitting: Plastic Surgery

## 2021-05-15 VITALS — BP 144/86 | HR 67 | Ht 61.0 in | Wt 172.0 lb

## 2021-05-15 DIAGNOSIS — Z853 Personal history of malignant neoplasm of breast: Secondary | ICD-10-CM

## 2021-05-15 DIAGNOSIS — N6489 Other specified disorders of breast: Secondary | ICD-10-CM | POA: Diagnosis not present

## 2021-05-15 NOTE — Progress Notes (Signed)
[place] [id number] [place] [id number]   CC:  Chief Complaint  Patient presents with   consult      Paula [name] is an 75 y.o. female.  HPI: Patient presents to discuss breast asymmetry.  She had breast conservation therapy for breast cancer on the right side in 2011.  This was done by Dr. Brantley Stage.  She is done fine from that and is done well from a cancer standpoint.  At the time she had considered left breast reduction for symmetry but was not pursued because of various other things going on in her life.  She is interested in pursuing that now.  She does have a heart history and is on Eliquis and has several other comorbidities.  She can come off her Eliquis for surgery according to her.  Allergies  Allergen Reactions   Amoxicillin Rash   Ceclor [Cefaclor] Rash   Doxycycline Rash   Penicillins Rash    Outpatient Encounter Medications as of 05/15/2021  Medication Sig Note   albuterol (PROVENTIL HFA;VENTOLIN HFA) 108 (90 BASE) MCG/ACT inhaler Inhale 2 puffs into the lungs 2 (two) times daily.     aspirin EC 81 MG tablet Take 81 mg by mouth every other day.    beclomethasone (QVAR) 40 MCG/ACT inhaler Inhale 1 puff into the lungs 2 (two) times daily.     Calcium 500-100 MG-UNIT CHEW Chew 1 each by mouth daily.     Fexofenadine HCl (ALLEGRA PO) Take 180 mg by mouth daily.     fish oil-omega-3 fatty acids 1000 MG capsule Take 1 g by mouth daily.     fluticasone (FLONASE) 50 MCG/ACT nasal spray Place 1 spray into both nostrils daily.    furosemide (LASIX) 40 MG tablet Take 1 tablet by mouth 3 (three) times a week.    gabapentin (NEURONTIN) 300 MG capsule     losartan-hydrochlorothiazide (HYZAAR) 100-25 MG per tablet Take 1 tablet by mouth daily.    montelukast (SINGULAIR) 10 MG tablet Take 10 mg by mouth at bedtime.    Multiple Vitamin (MULTIVITAMIN) capsule Take 1 capsule by mouth daily. 05/01/2013: sporadically   naproxen sodium (ALEVE) 220  MG tablet Take 220 mg by mouth 3 (three) times daily as needed.    Potassium 99 MG TABS Take 1 tablet by mouth daily.    simvastatin (ZOCOR) 20 MG tablet Take 20 mg by mouth daily.    terbinafine (LAMISIL) 250 MG tablet Take 1 tablet by mouth daily.    zinc gluconate 50 MG tablet Take 50 mg by mouth daily.    No facility-administered encounter medications on file as of 05/15/2021.     Past Medical History:  Diagnosis Date   Arthritis    Breast cancer ([place])    right side DCIS   Bronchitis    Conjunctival disorder, other    DCIS (ductal carcinoma in situ)    GERD (gastroesophageal reflux disease)    Gilbert's syndrome    Hyperlipidemia    Hypertension    Irregular heart beat    Personal history of radiation therapy    Radiation    completed after 33 treatments   Shortness of breath    Vitamin D deficiency     Past Surgical History:  Procedure Laterality Date   BREAST BIOPSY     right breast   BREAST LUMPECTOMY  1982   right side   CARPAL TUNNEL RELEASE Right 04/18/2015   Procedure: RIGHT  CARPAL TUNNEL RELEASE;  Surgeon: Daryll Brod, MD;  Location: [facility];  Service: Orthopedics;  Laterality: Right;  REGIONAL/FAB   History of hysteroscopy  04/16/14   TONSILLECTOMY      Family History  Problem Relation Age of Onset   Heart disease Father    Heart disease Sister    Heart disease Brother    Cancer Maternal Aunt        unaware   Heart disease Maternal Uncle    Breast cancer Neg Hx     Social History   Social History Narrative   Not on file  Denies current tobacco use  Review of Systems General: Denies fevers, chills, weight loss CV: Denies chest pain, shortness of breath, palpitations  Physical Exam Vitals with BMI 05/15/2021 12/14/2017 11/19/2016  Height '5\' 1"'$  - -  Weight 172 lbs 188 lbs 11 oz 177 lbs 13 oz  BMI [id number] - -  Systolic [id number]  Diastolic 86 68 69  Pulse 67 84 58    General:  No acute distress,  Alert and oriented, Non-Toxic,  Normal speech and affect Breast: She has grade 2 ptosis.  She is bigger on the left side than the right.  Sternal notch to nipple is 27 on the right and 30 on the left.  Nipple to fold is 9 cm on the right and 12 cm on the left.  No obvious scars on the left side.  She has an upper quadrant lumpectomy scar on the right.  Assessment/Plan Patient presents with breast asymmetry after breast conservation therapy on the right side for cancer.  We discussed a left-sided reduction for symmetry.  We discussed the risks include bleeding, infection, damage to surrounding structures need for additional procedures.  I discussed the location and orientation of the scars.  All of her questions were answered I do think this will be a good solution to her current concerns.  We will check with her cardiologist for assistance with management of her anticoagulation.  Paula [name] 05/15/2021, 4:44 PM

## 2021-07-04 IMAGING — MG MM DIGITAL SCREENING BILAT W/ TOMO AND CAD
8 series · 8 of 24 positions shown · non-contrast
Comparison: Previous exam(s).

ACR Breast Density Category a: The breast tissue is almost entirely
fatty.

CLINICAL DATA: Screening.

EXAM:
DIGITAL SCREENING BILATERAL MAMMOGRAM WITH TOMOSYNTHESIS AND CAD
TECHNIQUE: Bilateral screening digital craniocaudal and mediolateral oblique
mammograms were obtained. Bilateral screening digital breast
tomosynthesis was performed. The images were evaluated with
computer-aided detection.

[L CC synth-2D]
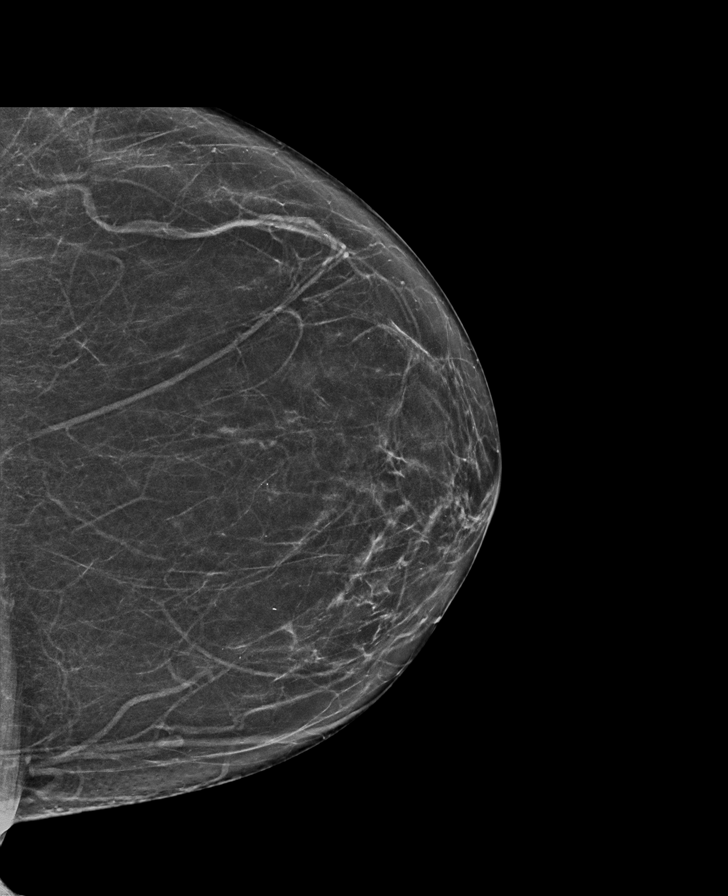

[R CC synth-2D]
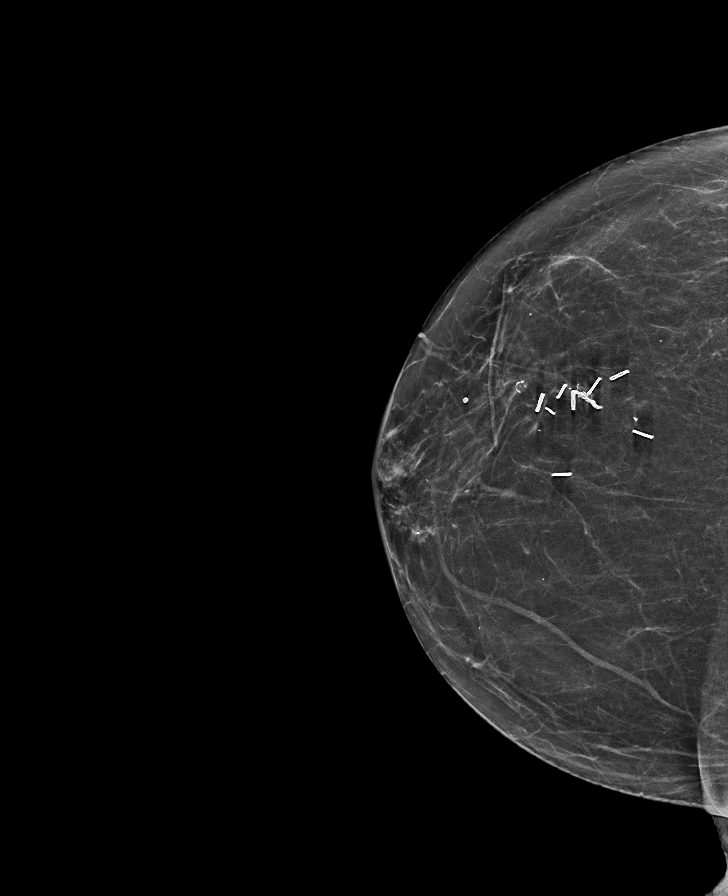

[R MLO synth-2D]
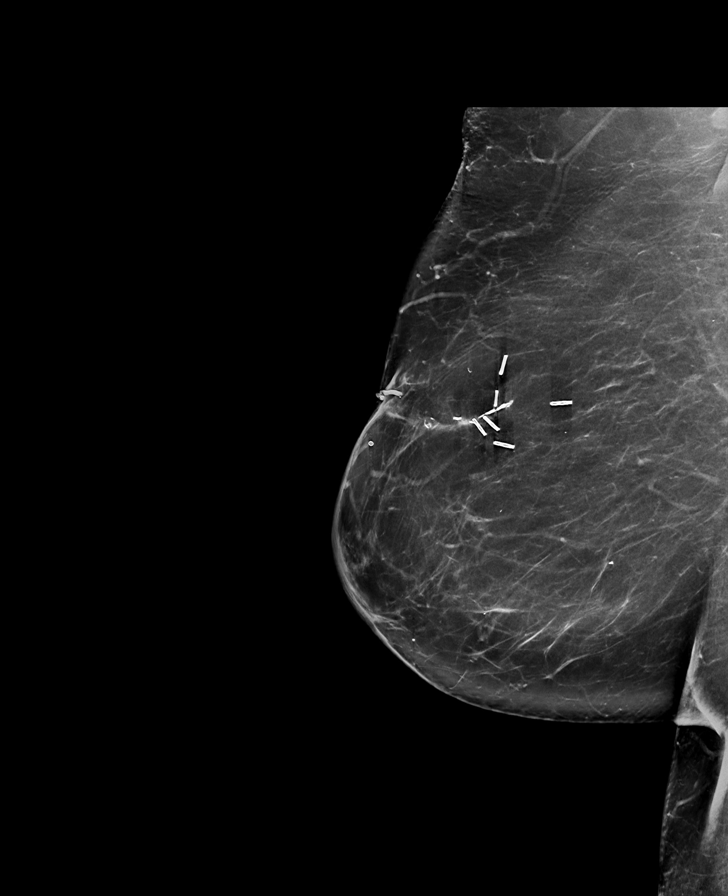

[L MLO synth-2D]
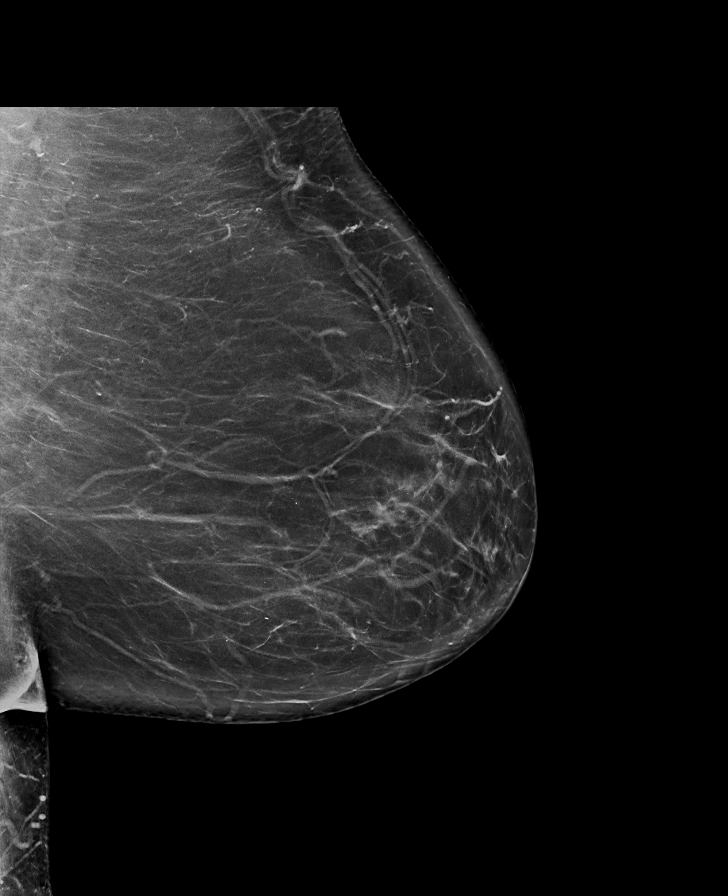

[L CC tomo · tomo slice 33/65.0]
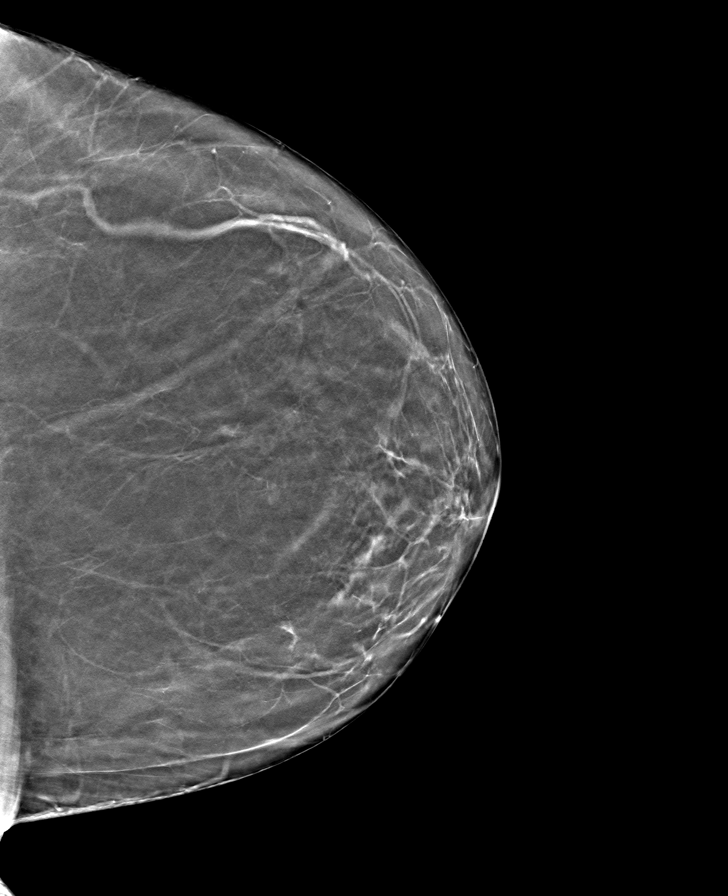

[L MLO tomo · tomo slice 42/83.0]
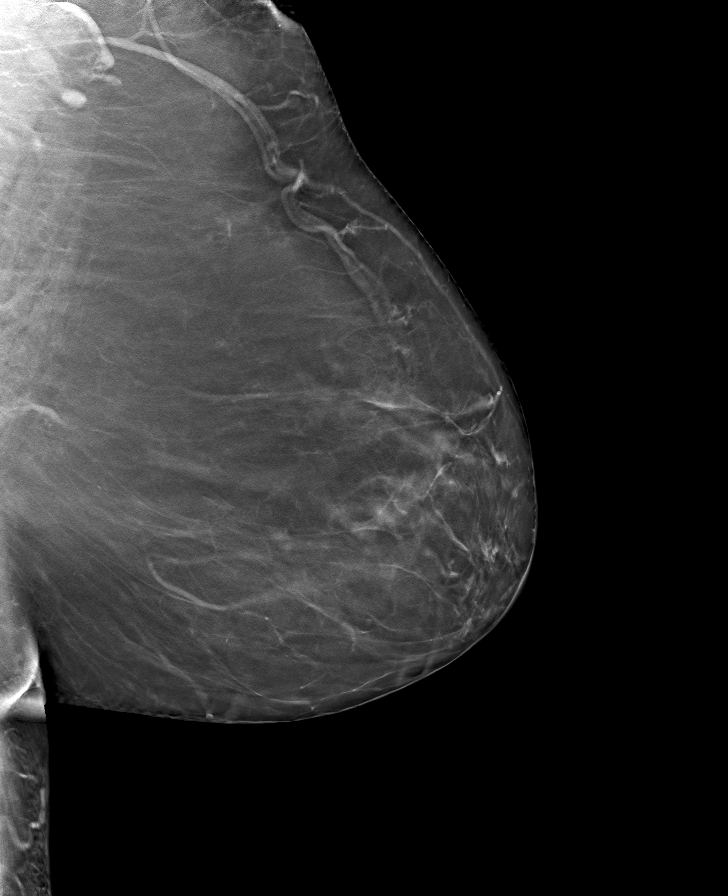

[R CC tomo · tomo slice 30/59.0]
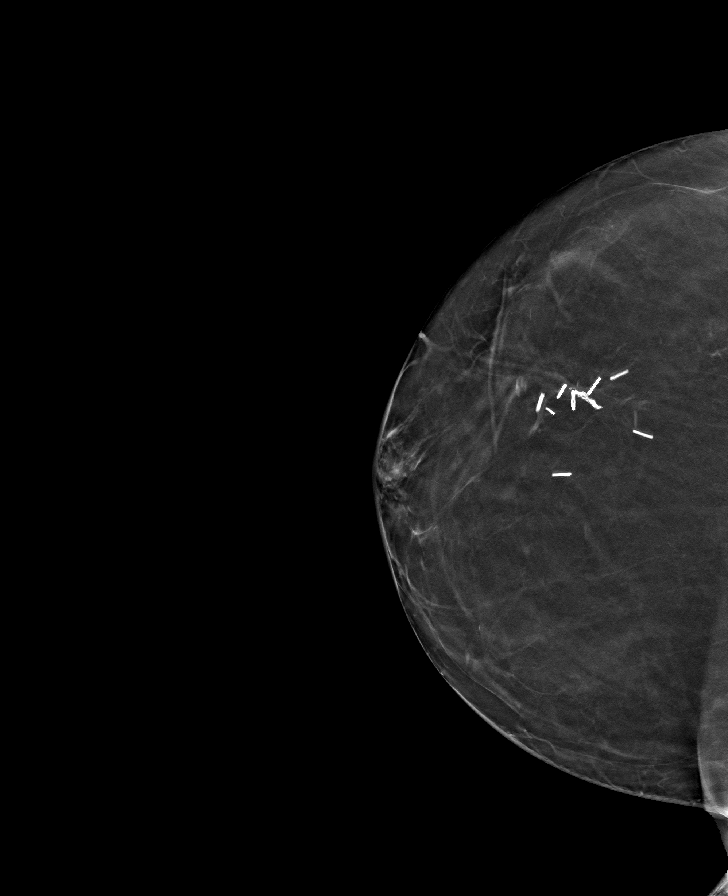

[R MLO tomo · tomo slice 43/84.0]
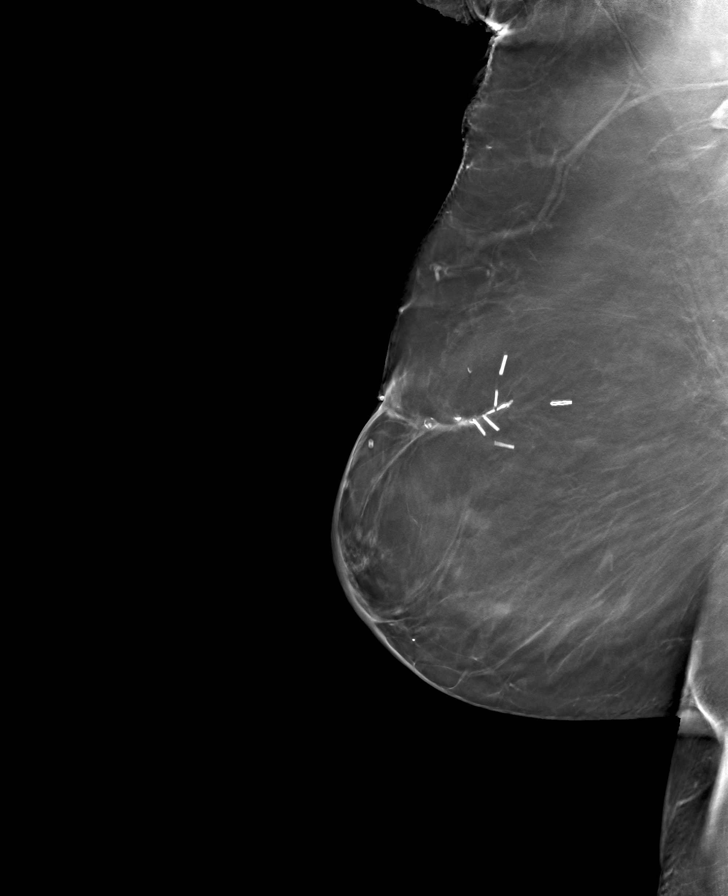

[8 of 24 positions shown; findings below may reference images not displayed]

FINDINGS: There are no findings suspicious for malignancy. The images were
evaluated with computer-aided detection.
IMPRESSION: No mammographic evidence of malignancy. A result letter of this
screening mammogram will be mailed directly to the patient.

RECOMMENDATION:
Screening mammogram in one year. (Code:JP-J-DD5)

BI-RADS CATEGORY  1: Negative.

## 2021-07-10 ENCOUNTER — Other Ambulatory Visit: Payer: Self-pay

## 2021-07-10 ENCOUNTER — Ambulatory Visit (INDEPENDENT_AMBULATORY_CARE_PROVIDER_SITE_OTHER): Payer: Medicare Other | Admitting: Surgical

## 2021-07-10 ENCOUNTER — Encounter: Payer: Self-pay | Admitting: Surgical

## 2021-07-10 VITALS — BP 138/79 | HR 55 | Ht 62.0 in | Wt 172.0 lb

## 2021-07-10 DIAGNOSIS — N6489 Other specified disorders of breast: Secondary | ICD-10-CM

## 2021-07-10 DIAGNOSIS — Z853 Personal history of malignant neoplasm of breast: Secondary | ICD-10-CM

## 2021-07-10 MED ORDER — HYDROCODONE-ACETAMINOPHEN 5-325 MG PO TABS: 1.0000 | ORAL_TABLET | Freq: Four times a day (QID) | ORAL | 0 refills | Status: AC | PRN

## 2021-07-10 MED ORDER — ONDANSETRON HCL 4 MG PO TABS: 4.0000 mg | ORAL_TABLET | Freq: Three times a day (TID) | ORAL | 0 refills | Status: AC | PRN

## 2021-07-10 NOTE — Progress Notes (Signed)
Patient ID: Paula [name], female    DOB: April 09, 1946, 75 y.o.   MRN: [id number]  Chief Complaint  Patient presents with   Pre-op Exam      ICD-10-CM   1. History of breast cancer  Z85.3     2. Breast asymmetry  N64.89       History of Present Illness: Paula [name] is a 75 y.o.  female  with a history of breast conservation therapy for right breast cancer in 2011.  She presents for preoperative evaluation for upcoming procedure, unilateral left breast Reduction, scheduled for 08/05/2021 with Dr.  Claudia Desanctis  The patient has not had problems with anesthesia. No history of DVT/PE.  No family history of DVT/PE.  No family or personal history of bleeding or clotting disorders.  No history of CVA/MI.  Patient is currently on Eliquis, she reports her cardiologist Dr. Sabra Heck prescribes this.  Reports he is in [place].  PMH Significant for: Hyperlipidemia, history of right side DCIS, GERD, hypertension, irregular heartbeat. Currently on Eliquis and aspirin 81 mg.   Past Medical History: Allergies: Allergies  Allergen Reactions   Amoxicillin Rash   Ceclor [Cefaclor] Rash   Doxycycline Rash   Penicillins Rash    Current Medications:  Current Outpatient Medications:    albuterol (PROVENTIL HFA;VENTOLIN HFA) 108 (90 BASE) MCG/ACT inhaler, Inhale 2 puffs into the lungs 2 (two) times daily. , Disp: , Rfl:    Apixaban (ELIQUIS PO), Take by mouth., Disp: , Rfl:    Calcium 500-100 MG-UNIT CHEW, Chew 1 each by mouth daily. , Disp: , Rfl:    ELIQUIS 5 MG TABS tablet, Take 5 mg by mouth 2 (two) times daily., Disp: , Rfl:    fish oil-omega-3 fatty acids 1000 MG capsule, Take 1 g by mouth daily. , Disp: , Rfl:    fluticasone (FLONASE) 50 MCG/ACT nasal spray, Place 1 spray into both nostrils daily., Disp: , Rfl:    furosemide (LASIX) 40 MG tablet, Take 1 tablet by mouth 3 (three) times a week., Disp: , Rfl:    gabapentin (NEURONTIN) 300 MG capsule, , Disp: , Rfl:     HYDROcodone-acetaminophen (NORCO) 5-325 MG tablet, Take 1 tablet by mouth every 6 (six) hours as needed for up to 5 days for severe pain., Disp: 20 tablet, Rfl: 0   losartan-hydrochlorothiazide (HYZAAR) 100-25 MG per tablet, Take 1 tablet by mouth daily., Disp: , Rfl:    Magnesium 400 MG CAPS, Take by mouth., Disp: , Rfl:    montelukast (SINGULAIR) 10 MG tablet, Take 10 mg by mouth at bedtime., Disp: , Rfl:    Multiple Vitamin (MULTIVITAMIN) capsule, Take 1 capsule by mouth daily., Disp: , Rfl:    ondansetron (ZOFRAN) 4 MG tablet, Take 1 tablet (4 mg total) by mouth every 8 (eight) hours as needed for nausea or vomiting., Disp: 20 tablet, Rfl: 0   Potassium 99 MG TABS, Take 1 tablet by mouth daily., Disp: , Rfl:    rosuvastatin (CRESTOR) 10 MG tablet, Take 10 mg by mouth at bedtime., Disp: , Rfl:    zinc gluconate 50 MG tablet, Take 50 mg by mouth daily., Disp: , Rfl:    Acetaminophen (TYLENOL PO), Take by mouth., Disp: , Rfl:    aspirin EC 81 MG tablet, Take 81 mg by mouth every other day., Disp: , Rfl:    beclomethasone (QVAR) 40 MCG/ACT inhaler, Inhale 1 puff into the lungs 2 (two) times daily. , Disp: , Rfl:  Fexofenadine HCl (ALLEGRA PO), Take 180 mg by mouth daily. , Disp: , Rfl:    naproxen sodium (ALEVE) 220 MG tablet, Take 220 mg by mouth 3 (three) times daily as needed., Disp: , Rfl:    simvastatin (ZOCOR) 20 MG tablet, Take 20 mg by mouth daily., Disp: , Rfl:    terbinafine (LAMISIL) 250 MG tablet, Take 1 tablet by mouth daily., Disp: , Rfl:   Past Medical Problems: Past Medical History:  Diagnosis Date   Arthritis    Breast cancer ([place])    right side DCIS   Bronchitis    Conjunctival disorder, other    DCIS (ductal carcinoma in situ)    GERD (gastroesophageal reflux disease)    Gilbert's syndrome    Hyperlipidemia    Hypertension    Irregular heart beat    Personal history of radiation therapy    Radiation    completed after 33 treatments   Shortness of breath     Vitamin D deficiency     Past Surgical History: Past Surgical History:  Procedure Laterality Date   BREAST BIOPSY     right breast   BREAST LUMPECTOMY  1982   right side   CARPAL TUNNEL RELEASE Right 04/18/2015   Procedure: RIGHT CARPAL TUNNEL RELEASE;  Surgeon: Daryll Brod, MD;  Location: [facility];  Service: Orthopedics;  Laterality: Right;  REGIONAL/FAB   History of hysteroscopy  04/16/14   TONSILLECTOMY      Social History: Social History   Socioeconomic History   Marital status: Divorced    Spouse name: Not on file   Number of children: Not on file   Years of education: Not on file   Highest education level: Not on file  Occupational History   Not on file  Tobacco Use   Smoking status: Former    Types: Cigarettes    Quit date: 02/03/1991    Years since quitting: 30.4   Smokeless tobacco: Never  Substance and Sexual Activity   Alcohol use: No   Drug use: No   Sexual activity: Not on file  Other Topics Concern   Not on file  Social History Narrative   Not on file   Social Determinants of Health   Financial Resource Strain: Not on file  Food Insecurity: Not on file  Transportation Needs: Not on file  Physical Activity: Not on file  Stress: Not on file  Social Connections: Not on file  Intimate Partner Violence: Not on file    Family History: Family History  Problem Relation Age of Onset   Heart disease Father    Heart disease Sister    Heart disease Brother    Cancer Maternal Aunt        unaware   Heart disease Maternal Uncle    Breast cancer Neg Hx     Review of Systems: Review of Systems  Constitutional: Negative.   Respiratory: Negative.    Cardiovascular: Negative.   Gastrointestinal: Negative.   Genitourinary: Negative.   Neurological: Negative.    Physical Exam: Vital Signs BP 138/79 (BP Location: Left Arm, Patient Position: Sitting, Cuff Size: Normal)   Pulse (!) 55   Ht 5\' 2"  (1.575 m)   Wt 172 lb (78 kg)   SpO2 92%    BMI 31.46 kg/m   Physical Exam  Constitutional:      General: Not in acute distress.    Appearance: Normal appearance. Not ill-appearing.  HENT:     Head: Normocephalic and atraumatic.  Eyes:     Pupils: Pupils are equal, round Neck:     Musculoskeletal: Normal range of motion.  Cardiovascular:     Rate and Rhythm: Normal rate    Pulses: Normal pulses.  Pulmonary:     Effort: Pulmonary effort is normal. No respiratory distress.  Musculoskeletal: Normal range of motion.  Skin:    General: Skin is warm and dry.     Findings: No erythema or rash.  Neurological:     General: No focal deficit present.     Mental Status: Alert and oriented to person, place, and time. Mental status is at baseline.     Motor: No weakness.  Psychiatric:        Mood and Affect: Mood normal.        Behavior: Behavior normal.    Assessment/Plan: The patient is scheduled for bilateral breast reduction with Dr. Claudia Desanctis.  Risks, benefits, and alternatives of procedure discussed, questions answered and consent obtained.    Smoking Status: Non-smoker, quit 30 years ago; Counseling Given?  N/A Last Mammogram: 03/18/2021; Results: No mammographic evidence of malignancy  Caprini Score: 8, high; Risk Factors include: Age, BMI greater than 25, varicose veins and previous malignancy and length of planned surgery. Recommendation for mechanical and pharmacological prophylaxis. Encourage early ambulation.   Patient reports cardiology is aware of upcoming surgery, she believes they have provided her with a preop recommendation she but she forgot that at home today.  She is going to check and call the office to inform us of this.  We will send a cardiac clearance to patient's cardiologist Dr. Sabra Heck in [place] with [place] to confirm when she can stop Eliquis and restart prior to surgery.  We discussed holding multivitamin, fish oil, naproxen prior to surgery.  She reports she is no longer on aspirin.  Pictures  obtained: @consult   Post-op Rx sent to pharmacy: [facility] [place]  Patient was provided with the breast reduction and General Surgical Risk consent document and Pain Medication Agreement prior to their appointment.  They had adequate time to read through the risk consent documents and Pain Medication Agreement. We also discussed them in person together during this preop appointment. All of their questions were answered to their satisfaction.  Recommended calling if they have any further questions.  Risk consent form and Pain Medication Agreement to be scanned into patient's chart.  The risk that can be encountered with breast reduction were discussed and include the following but not limited to these:  Breast asymmetry, fluid accumulation, firmness of the breast, inability to breast feed, loss of nipple or areola, skin loss, decrease or no nipple sensation, fat necrosis of the breast tissue, bleeding, infection, healing delay.  There are risks of anesthesia, changes to skin sensation and injury to nerves or blood vessels.  The muscle can be temporarily or permanently injured.  You may have an allergic reaction to tape, suture, glue, blood products which can result in skin discoloration, swelling, pain, skin lesions, poor healing.  Any of these can lead to the need for revisonal surgery or stage procedures.  A reduction has potential to interfere with diagnostic procedures.  Nipple or breast piercing can increase risks of infection.  This procedure is best done when the breast is fully developed.  Changes in the breast will continue to occur over time.  Pregnancy can alter the outcomes of previous breast reduction surgery, weight gain and weigh loss can also effect the long term appearance.    Electronically signed  by: Carola Rhine Massey Ruhland, PA-C 07/10/2021 2:36 PM

## 2021-07-10 NOTE — H&P (View-Only) (Signed)
Patient ID: Paula [name], female    DOB: 06-01-1946, 75 y.o.   MRN: [id number]  Chief Complaint  Patient presents with   Pre-op Exam      ICD-10-CM   1. History of breast cancer  Z85.3     2. Breast asymmetry  N64.89       History of Present Illness: Paula [name] is a 75 y.o.  female  with a history of breast conservation therapy for right breast cancer in 2011.  She presents for preoperative evaluation for upcoming procedure, unilateral left breast Reduction, scheduled for 08/05/2021 with Dr.  Claudia Desanctis  The patient has not had problems with anesthesia. No history of DVT/PE.  No family history of DVT/PE.  No family or personal history of bleeding or clotting disorders.  No history of CVA/MI.  Patient is currently on Eliquis, she reports her cardiologist Dr. Sabra Heck prescribes this.  Reports he is in [place].  PMH Significant for: Hyperlipidemia, history of right side DCIS, GERD, hypertension, irregular heartbeat. Currently on Eliquis and aspirin 81 mg.   Past Medical History: Allergies: Allergies  Allergen Reactions   Amoxicillin Rash   Ceclor [Cefaclor] Rash   Doxycycline Rash   Penicillins Rash    Current Medications:  Current Outpatient Medications:    albuterol (PROVENTIL HFA;VENTOLIN HFA) 108 (90 BASE) MCG/ACT inhaler, Inhale 2 puffs into the lungs 2 (two) times daily. , Disp: , Rfl:    Apixaban (ELIQUIS PO), Take by mouth., Disp: , Rfl:    Calcium 500-100 MG-UNIT CHEW, Chew 1 each by mouth daily. , Disp: , Rfl:    ELIQUIS 5 MG TABS tablet, Take 5 mg by mouth 2 (two) times daily., Disp: , Rfl:    fish oil-omega-3 fatty acids 1000 MG capsule, Take 1 g by mouth daily. , Disp: , Rfl:    fluticasone (FLONASE) 50 MCG/ACT nasal spray, Place 1 spray into both nostrils daily., Disp: , Rfl:    furosemide (LASIX) 40 MG tablet, Take 1 tablet by mouth 3 (three) times a week., Disp: , Rfl:    gabapentin (NEURONTIN) 300 MG capsule, , Disp: , Rfl:     HYDROcodone-acetaminophen (NORCO) 5-325 MG tablet, Take 1 tablet by mouth every 6 (six) hours as needed for up to 5 days for severe pain., Disp: 20 tablet, Rfl: 0   losartan-hydrochlorothiazide (HYZAAR) 100-25 MG per tablet, Take 1 tablet by mouth daily., Disp: , Rfl:    Magnesium 400 MG CAPS, Take by mouth., Disp: , Rfl:    montelukast (SINGULAIR) 10 MG tablet, Take 10 mg by mouth at bedtime., Disp: , Rfl:    Multiple Vitamin (MULTIVITAMIN) capsule, Take 1 capsule by mouth daily., Disp: , Rfl:    ondansetron (ZOFRAN) 4 MG tablet, Take 1 tablet (4 mg total) by mouth every 8 (eight) hours as needed for nausea or vomiting., Disp: 20 tablet, Rfl: 0   Potassium 99 MG TABS, Take 1 tablet by mouth daily., Disp: , Rfl:    rosuvastatin (CRESTOR) 10 MG tablet, Take 10 mg by mouth at bedtime., Disp: , Rfl:    zinc gluconate 50 MG tablet, Take 50 mg by mouth daily., Disp: , Rfl:    Acetaminophen (TYLENOL PO), Take by mouth., Disp: , Rfl:    aspirin EC 81 MG tablet, Take 81 mg by mouth every other day., Disp: , Rfl:    beclomethasone (QVAR) 40 MCG/ACT inhaler, Inhale 1 puff into the lungs 2 (two) times daily. , Disp: , Rfl:  Fexofenadine HCl (ALLEGRA PO), Take 180 mg by mouth daily. , Disp: , Rfl:    naproxen sodium (ALEVE) 220 MG tablet, Take 220 mg by mouth 3 (three) times daily as needed., Disp: , Rfl:    simvastatin (ZOCOR) 20 MG tablet, Take 20 mg by mouth daily., Disp: , Rfl:    terbinafine (LAMISIL) 250 MG tablet, Take 1 tablet by mouth daily., Disp: , Rfl:   Past Medical Problems: Past Medical History:  Diagnosis Date   Arthritis    Breast cancer ([place])    right side DCIS   Bronchitis    Conjunctival disorder, other    DCIS (ductal carcinoma in situ)    GERD (gastroesophageal reflux disease)    Gilbert's syndrome    Hyperlipidemia    Hypertension    Irregular heart beat    Personal history of radiation therapy    Radiation    completed after 33 treatments   Shortness of breath     Vitamin D deficiency     Past Surgical History: Past Surgical History:  Procedure Laterality Date   BREAST BIOPSY     right breast   BREAST LUMPECTOMY  1982   right side   CARPAL TUNNEL RELEASE Right 04/18/2015   Procedure: RIGHT CARPAL TUNNEL RELEASE;  Surgeon: Daryll Brod, MD;  Location: [facility];  Service: Orthopedics;  Laterality: Right;  REGIONAL/FAB   History of hysteroscopy  04/16/14   TONSILLECTOMY      Social History: Social History   Socioeconomic History   Marital status: Divorced    Spouse name: Not on file   Number of children: Not on file   Years of education: Not on file   Highest education level: Not on file  Occupational History   Not on file  Tobacco Use   Smoking status: Former    Types: Cigarettes    Quit date: 02/03/1991    Years since quitting: 30.4   Smokeless tobacco: Never  Substance and Sexual Activity   Alcohol use: No   Drug use: No   Sexual activity: Not on file  Other Topics Concern   Not on file  Social History Narrative   Not on file   Social Determinants of Health   Financial Resource Strain: Not on file  Food Insecurity: Not on file  Transportation Needs: Not on file  Physical Activity: Not on file  Stress: Not on file  Social Connections: Not on file  Intimate Partner Violence: Not on file    Family History: Family History  Problem Relation Age of Onset   Heart disease Father    Heart disease Sister    Heart disease Brother    Cancer Maternal Aunt        unaware   Heart disease Maternal Uncle    Breast cancer Neg Hx     Review of Systems: Review of Systems  Constitutional: Negative.   Respiratory: Negative.    Cardiovascular: Negative.   Gastrointestinal: Negative.   Genitourinary: Negative.   Neurological: Negative.    Physical Exam: Vital Signs BP 138/79 (BP Location: Left Arm, Patient Position: Sitting, Cuff Size: Normal)   Pulse (!) 55   Ht 5\' 2"  (1.575 m)   Wt 172 lb (78 kg)   SpO2 92%    BMI 31.46 kg/m   Physical Exam  Constitutional:      General: Not in acute distress.    Appearance: Normal appearance. Not ill-appearing.  HENT:     Head: Normocephalic and atraumatic.  Eyes:     Pupils: Pupils are equal, round Neck:     Musculoskeletal: Normal range of motion.  Cardiovascular:     Rate and Rhythm: Normal rate    Pulses: Normal pulses.  Pulmonary:     Effort: Pulmonary effort is normal. No respiratory distress.  Musculoskeletal: Normal range of motion.  Skin:    General: Skin is warm and dry.     Findings: No erythema or rash.  Neurological:     General: No focal deficit present.     Mental Status: Alert and oriented to person, place, and time. Mental status is at baseline.     Motor: No weakness.  Psychiatric:        Mood and Affect: Mood normal.        Behavior: Behavior normal.    Assessment/Plan: The patient is scheduled for bilateral breast reduction with Dr. Claudia Desanctis.  Risks, benefits, and alternatives of procedure discussed, questions answered and consent obtained.    Smoking Status: Non-smoker, quit 30 years ago; Counseling Given?  N/A Last Mammogram: 03/18/2021; Results: No mammographic evidence of malignancy  Caprini Score: 8, high; Risk Factors include: Age, BMI greater than 25, varicose veins and previous malignancy and length of planned surgery. Recommendation for mechanical and pharmacological prophylaxis. Encourage early ambulation.   Patient reports cardiology is aware of upcoming surgery, she believes they have provided her with a preop recommendation she but she forgot that at home today.  She is going to check and call the office to inform us of this.  We will send a cardiac clearance to patient's cardiologist Dr. Sabra Heck in [place] with [facility] to confirm when she can stop Eliquis and restart prior to surgery.  We discussed holding multivitamin, fish oil, naproxen prior to surgery.  She reports she is no longer on aspirin.  Pictures  obtained: @consult   Post-op Rx sent to pharmacy: [facility], [place]  Patient was provided with the breast reduction and General Surgical Risk consent document and Pain Medication Agreement prior to their appointment.  They had adequate time to read through the risk consent documents and Pain Medication Agreement. We also discussed them in person together during this preop appointment. All of their questions were answered to their satisfaction.  Recommended calling if they have any further questions.  Risk consent form and Pain Medication Agreement to be scanned into patient's chart.  The risk that can be encountered with breast reduction were discussed and include the following but not limited to these:  Breast asymmetry, fluid accumulation, firmness of the breast, inability to breast feed, loss of nipple or areola, skin loss, decrease or no nipple sensation, fat necrosis of the breast tissue, bleeding, infection, healing delay.  There are risks of anesthesia, changes to skin sensation and injury to nerves or blood vessels.  The muscle can be temporarily or permanently injured.  You may have an allergic reaction to tape, suture, glue, blood products which can result in skin discoloration, swelling, pain, skin lesions, poor healing.  Any of these can lead to the need for revisonal surgery or stage procedures.  A reduction has potential to interfere with diagnostic procedures.  Nipple or breast piercing can increase risks of infection.  This procedure is best done when the breast is fully developed.  Changes in the breast will continue to occur over time.  Pregnancy can alter the outcomes of previous breast reduction surgery, weight gain and weigh loss can also effect the long term appearance.    Electronically signed  by: Carola Rhine Oluwatamilore Starnes, PA-C 07/10/2021 2:36 PM

## 2021-07-28 ENCOUNTER — Encounter (HOSPITAL_BASED_OUTPATIENT_CLINIC_OR_DEPARTMENT_OTHER): Payer: Self-pay

## 2021-07-28 ENCOUNTER — Other Ambulatory Visit: Payer: Self-pay

## 2021-07-31 NOTE — Progress Notes (Signed)
Spoke with Colletta Maryland at Dr. Donell Sievert office about hold time for Eliquis, She will call back.

## 2021-08-01 ENCOUNTER — Telehealth: Payer: Self-pay | Admitting: Surgical

## 2021-08-01 ENCOUNTER — Encounter: Payer: Self-pay | Admitting: Surgical

## 2021-08-01 ENCOUNTER — Telehealth: Payer: Self-pay | Admitting: Plastic Surgery

## 2021-08-01 NOTE — Telephone Encounter (Signed)
Patient was recently diagnosed with a bladder infection and is asking if it will affect her surgery that is scheduled for 10/25. Patient also is requesting guidance on when to stop/ start her blood thinners. Please call to advise [phone]. Thank you.

## 2021-08-01 NOTE — Progress Notes (Signed)
Surgical Clearance has been received from Dr. Orpah Greek for patient's upcoming surgery Left breast reduction with Dr. Claudia Desanctis . Medications to hold prior to surgery Eliquis (Stop taking: 08/03/21 Begin retaking 08/06/21)  Must take Metoprolol night before surgery to prevent RVR.

## 2021-08-01 NOTE — Telephone Encounter (Signed)
Returned patients call. LMVM-per Matt, advised to stop blood thinner on Sunday. Call office Monday and let us know when she started ABX.

## 2021-08-01 NOTE — Telephone Encounter (Signed)
Called patient to inform her to stop her Eliquis prior to upcoming surgery with Dr. Claudia Desanctis. No answer. Left VM to return call to discuss. Called both mobile and home phone

## 2021-08-04 ENCOUNTER — Telehealth: Payer: Self-pay | Admitting: Plastic Surgery

## 2021-08-04 NOTE — Telephone Encounter (Signed)
Per patient- experiencing cough with no phlegm, has current bladder infection, had a nose bleed, and has a "boil" on ear. White blood cell count 12.7. Reduction scheduled for 10/25. Please call to advise [phone] or [phone]. Thank you.

## 2021-08-05 ENCOUNTER — Other Ambulatory Visit: Payer: Self-pay

## 2021-08-05 ENCOUNTER — Ambulatory Visit (HOSPITAL_BASED_OUTPATIENT_CLINIC_OR_DEPARTMENT_OTHER)
Admission: RE | Admit: 2021-08-05 | Discharge: 2021-08-05 | Disposition: A | Discharge status: Home / Self Care | Payer: Medicare Other | Attending: Plastic Surgery | Admitting: Plastic Surgery

## 2021-08-05 ENCOUNTER — Encounter (HOSPITAL_BASED_OUTPATIENT_CLINIC_OR_DEPARTMENT_OTHER): Payer: Self-pay | Admitting: Plastic Surgery

## 2021-08-05 ENCOUNTER — Encounter (HOSPITAL_BASED_OUTPATIENT_CLINIC_OR_DEPARTMENT_OTHER)
Admission: RE | Disposition: A | Discharge status: Home / Self Care | Payer: Self-pay | Source: Home / Self Care | Attending: Plastic Surgery

## 2021-08-05 ENCOUNTER — Ambulatory Visit (HOSPITAL_BASED_OUTPATIENT_CLINIC_OR_DEPARTMENT_OTHER): Payer: Medicare Other | Admitting: Anesthesiology

## 2021-08-05 DIAGNOSIS — Z880 Allergy status to penicillin: Secondary | ICD-10-CM | POA: Diagnosis not present

## 2021-08-05 DIAGNOSIS — K219 Gastro-esophageal reflux disease without esophagitis: Secondary | ICD-10-CM | POA: Insufficient documentation

## 2021-08-05 DIAGNOSIS — N6489 Other specified disorders of breast: Secondary | ICD-10-CM | POA: Insufficient documentation

## 2021-08-05 DIAGNOSIS — E669 Obesity, unspecified: Secondary | ICD-10-CM | POA: Insufficient documentation

## 2021-08-05 DIAGNOSIS — Z923 Personal history of irradiation: Secondary | ICD-10-CM | POA: Diagnosis not present

## 2021-08-05 DIAGNOSIS — J449 Chronic obstructive pulmonary disease, unspecified: Secondary | ICD-10-CM | POA: Insufficient documentation

## 2021-08-05 DIAGNOSIS — I10 Essential (primary) hypertension: Secondary | ICD-10-CM | POA: Insufficient documentation

## 2021-08-05 DIAGNOSIS — Z7901 Long term (current) use of anticoagulants: Secondary | ICD-10-CM | POA: Diagnosis not present

## 2021-08-05 DIAGNOSIS — Z87891 Personal history of nicotine dependence: Secondary | ICD-10-CM | POA: Diagnosis not present

## 2021-08-05 DIAGNOSIS — I4891 Unspecified atrial fibrillation: Secondary | ICD-10-CM | POA: Diagnosis not present

## 2021-08-05 DIAGNOSIS — Z881 Allergy status to other antibiotic agents status: Secondary | ICD-10-CM | POA: Diagnosis not present

## 2021-08-05 DIAGNOSIS — Z853 Personal history of malignant neoplasm of breast: Secondary | ICD-10-CM

## 2021-08-05 DIAGNOSIS — Z6831 Body mass index (BMI) 31.0-31.9, adult: Secondary | ICD-10-CM | POA: Insufficient documentation

## 2021-08-05 DIAGNOSIS — E785 Hyperlipidemia, unspecified: Secondary | ICD-10-CM | POA: Diagnosis not present

## 2021-08-05 DIAGNOSIS — Z79899 Other long term (current) drug therapy: Secondary | ICD-10-CM | POA: Diagnosis not present

## 2021-08-05 DIAGNOSIS — Z7982 Long term (current) use of aspirin: Secondary | ICD-10-CM | POA: Insufficient documentation

## 2021-08-05 DIAGNOSIS — N651 Disproportion of reconstructed breast: Secondary | ICD-10-CM | POA: Diagnosis not present

## 2021-08-05 HISTORY — PX: UNILATERAL BREAST REDUCTION: SHX6885

## 2021-08-05 HISTORY — DX: Family history of other specified conditions: Z84.89

## 2021-08-05 LAB — BASIC METABOLIC PANEL
Anion gap: 9 (ref 5–15)
BUN: 9 mg/dL (ref 8–23)
CO2: 27 mmol/L (ref 22–32)
Calcium: 9.7 mg/dL (ref 8.9–10.3)
Chloride: 101 mmol/L (ref 98–111)
Creatinine, Ser: 0.72 mg/dL (ref 0.44–1.00)
GFR, Estimated: >60 mL/min (ref >60)
Glucose, Bld: 94 mg/dL (ref 70–99)
Potassium: 3.5 mmol/L (ref 3.5–5.1)
Sodium: 137 mmol/L (ref 135–145)

## 2021-08-05 SURGERY — MAMMOPLASTY, REDUCTION, UNILATERAL
Anesthesia: General | Site: Breast | Laterality: Left

## 2021-08-05 MED ORDER — TRANEXAMIC ACID-NACL 1000-0.7 MG/100ML-% IV SOLN
1000.0000 mg | INTRAVENOUS | Status: AC
  Administered 2021-08-05: 1000 mg via INTRAVENOUS

## 2021-08-05 MED ORDER — OXYCODONE HCL 5 MG/5ML PO SOLN: 5.0000 mg | Freq: Once | ORAL | Status: AC | PRN

## 2021-08-05 MED ORDER — PROMETHAZINE HCL 25 MG/ML IJ SOLN: 6.2500 mg | INTRAMUSCULAR | Status: DC | PRN

## 2021-08-05 MED ORDER — PROPOFOL 10 MG/ML IV BOLUS
INTRAVENOUS | Status: DC | PRN
  Administered 2021-08-05: 200 mg via INTRAVENOUS

## 2021-08-05 MED ORDER — EPHEDRINE SULFATE 50 MG/ML IJ SOLN
INTRAMUSCULAR | Status: DC | PRN
  Administered 2021-08-05: 10 mg via INTRAVENOUS

## 2021-08-05 MED ORDER — FENTANYL CITRATE (PF) 100 MCG/2ML IJ SOLN
INTRAMUSCULAR | Status: DC | PRN
  Administered 2021-08-05: 50 ug via INTRAVENOUS
  Administered 2021-08-05 (×2): 25 ug via INTRAVENOUS

## 2021-08-05 MED ORDER — MIDAZOLAM HCL 5 MG/5ML IJ SOLN
INTRAMUSCULAR | Status: DC | PRN
  Administered 2021-08-05: 1 mg via INTRAVENOUS

## 2021-08-05 MED ORDER — HYDROMORPHONE HCL 1 MG/ML IJ SOLN: 0.2500 mg | INTRAMUSCULAR | Status: DC | PRN

## 2021-08-05 MED ORDER — LIDOCAINE HCL (CARDIAC) PF 100 MG/5ML IV SOSY
PREFILLED_SYRINGE | INTRAVENOUS | Status: DC | PRN
  Administered 2021-08-05: 60 mg via INTRATRACHEAL

## 2021-08-05 MED ORDER — PROPOFOL 10 MG/ML IV BOLUS
INTRAVENOUS | Status: AC
  Filled 2021-08-05: qty 20

## 2021-08-05 MED ORDER — LACTATED RINGERS IV SOLN
INTRAVENOUS | Status: DC | PRN
  Administered 2021-08-05: 200 mL

## 2021-08-05 MED ORDER — DEXAMETHASONE SODIUM PHOSPHATE 10 MG/ML IJ SOLN
INTRAMUSCULAR | Status: DC | PRN
  Administered 2021-08-05: 4 mg via INTRAVENOUS

## 2021-08-05 MED ORDER — TRANEXAMIC ACID-NACL 1000-0.7 MG/100ML-% IV SOLN
INTRAVENOUS | Status: AC
  Filled 2021-08-05: qty 100

## 2021-08-05 MED ORDER — 0.9 % SODIUM CHLORIDE (POUR BTL) OPTIME
TOPICAL | Status: DC | PRN
  Administered 2021-08-05: 1000 mL

## 2021-08-05 MED ORDER — OXYCODONE HCL 5 MG PO TABS
5.0000 mg | ORAL_TABLET | Freq: Once | ORAL | Status: AC | PRN
Start: 2021-08-05 — End: 2021-08-05
  Administered 2021-08-05: 5 mg via ORAL

## 2021-08-05 MED ORDER — ONDANSETRON HCL 4 MG/2ML IJ SOLN
INTRAMUSCULAR | Status: AC
  Filled 2021-08-05: qty 2

## 2021-08-05 MED ORDER — MIDAZOLAM HCL 2 MG/2ML IJ SOLN
INTRAMUSCULAR | Status: AC
  Filled 2021-08-05: qty 2

## 2021-08-05 MED ORDER — FENTANYL CITRATE (PF) 100 MCG/2ML IJ SOLN
INTRAMUSCULAR | Status: AC
  Filled 2021-08-05: qty 2

## 2021-08-05 MED ORDER — LACTATED RINGERS IV SOLN: INTRAVENOUS | Status: DC

## 2021-08-05 MED ORDER — AMISULPRIDE (ANTIEMETIC) 5 MG/2ML IV SOLN
10.0000 mg | Freq: Once | INTRAVENOUS | Status: DC | PRN
Start: 2021-08-05 — End: 2021-08-05

## 2021-08-05 MED ORDER — ONDANSETRON HCL 4 MG/2ML IJ SOLN
INTRAMUSCULAR | Status: DC | PRN
  Administered 2021-08-05: 4 mg via INTRAVENOUS

## 2021-08-05 MED ORDER — CLINDAMYCIN PHOSPHATE 900 MG/50ML IV SOLN
INTRAVENOUS | Status: AC
  Filled 2021-08-05: qty 50

## 2021-08-05 MED ORDER — OXYCODONE HCL 5 MG PO TABS
ORAL_TABLET | ORAL | Status: AC
  Filled 2021-08-05: qty 1

## 2021-08-05 MED ORDER — CLINDAMYCIN PHOSPHATE 900 MG/50ML IV SOLN
900.0000 mg | INTRAVENOUS | Status: AC
  Administered 2021-08-05: 900 mg via INTRAVENOUS

## 2021-08-05 SURGICAL SUPPLY — 45 items
APL PRP STRL LF DISP 70% ISPRP (MISCELLANEOUS) ×1
APL SKNCLS STERI-STRIP NONHPOA (GAUZE/BANDAGES/DRESSINGS) ×1
BENZOIN TINCTURE PRP APPL 2/3 (GAUZE/BANDAGES/DRESSINGS) ×2 IMPLANT
BLADE SURG 10 STRL SS (BLADE) ×2 IMPLANT
BNDG CMPR MED 10X6 ELC LF (GAUZE/BANDAGES/DRESSINGS) ×1
BNDG ELASTIC 6X10 VLCR STRL LF (GAUZE/BANDAGES/DRESSINGS) ×2 IMPLANT
CANISTER SUCT 1200ML W/VALVE (MISCELLANEOUS) ×2 IMPLANT
CHLORAPREP W/TINT 26 (MISCELLANEOUS) ×2 IMPLANT
COVER BACK TABLE 60X90IN (DRAPES) ×2 IMPLANT
COVER MAYO STAND STRL (DRAPES) ×2 IMPLANT
DRAPE LAPAROSCOPIC ABDOMINAL (DRAPES) ×2 IMPLANT
DRAPE UTILITY XL STRL (DRAPES) ×2 IMPLANT
DRSG PAD ABDOMINAL 8X10 ST (GAUZE/BANDAGES/DRESSINGS) ×2 IMPLANT
ELECT REM PT RETURN 9FT ADLT (ELECTROSURGICAL) ×2
ELECTRODE REM PT RTRN 9FT ADLT (ELECTROSURGICAL) ×1 IMPLANT
GAUZE SPONGE 4X4 12PLY STRL (GAUZE/BANDAGES/DRESSINGS) ×2 IMPLANT
GLOVE SRG 8 PF TXTR STRL LF DI (GLOVE) ×1 IMPLANT
GLOVE SURG ENC MOIS LTX SZ7.5 (GLOVE) ×2 IMPLANT
GLOVE SURG ENC TEXT LTX SZ7.5 (GLOVE) ×2 IMPLANT
GLOVE SURG UNDER POLY LF SZ8 (GLOVE) ×2
GOWN STRL REUS W/ TWL LRG LVL3 (GOWN DISPOSABLE) ×3 IMPLANT
GOWN STRL REUS W/TWL LRG LVL3 (GOWN DISPOSABLE) ×6
NDL SAFETY ECLIPSE 18X1.5 (NEEDLE) ×1 IMPLANT
NEEDLE FILTER BLUNT 18X 1/2SAF (NEEDLE) ×1
NEEDLE FILTER BLUNT 18X1 1/2 (NEEDLE) ×1 IMPLANT
NEEDLE HYPO 18GX1.5 SHARP (NEEDLE) ×2
NEEDLE SPNL 18GX3.5 QUINCKE PK (NEEDLE) ×2 IMPLANT
NS IRRIG 1000ML POUR BTL (IV SOLUTION) ×2 IMPLANT
PACK BASIN DAY SURGERY FS (CUSTOM PROCEDURE TRAY) ×2 IMPLANT
PENCIL SMOKE EVACUATOR (MISCELLANEOUS) ×2 IMPLANT
SLEEVE SCD COMPRESS KNEE MED (STOCKING) ×2 IMPLANT
SPONGE T-LAP 18X18 ~~LOC~~+RFID (SPONGE) ×2 IMPLANT
STAPLER INSORB 30 2030 C-SECTI (MISCELLANEOUS) ×2 IMPLANT
STAPLER VISISTAT 35W (STAPLE) ×2 IMPLANT
STRIP SUTURE WOUND CLOSURE 1/2 (MISCELLANEOUS) ×2 IMPLANT
SUT MNCRL AB 4-0 PS2 18 (SUTURE) ×4 IMPLANT
SUT PDS 3-0 CT2 (SUTURE) ×4
SUT PDS II 3-0 CT2 27 ABS (SUTURE) ×2 IMPLANT
SUT VLOC 180 P-14 24 (SUTURE) ×2 IMPLANT
SYR BULB EAR ULCER 3OZ GRN STR (SYRINGE) ×2 IMPLANT
TOWEL GREEN STERILE FF (TOWEL DISPOSABLE) ×2 IMPLANT
TUBE CONNECTING 20X1/4 (TUBING) ×2 IMPLANT
TUBING INFILTRATION IT-10001 (TUBING) ×2 IMPLANT
UNDERPAD 30X36 HEAVY ABSORB (UNDERPADS AND DIAPERS) ×2 IMPLANT
YANKAUER SUCT BULB TIP NO VENT (SUCTIONS) ×2 IMPLANT

## 2021-08-05 NOTE — Op Note (Signed)
Operative Note   DATE OF OPERATION: 08/05/2021  SURGICAL DEPARTMENT: Plastic Surgery  PREOPERATIVE DIAGNOSES: Breast asymmetry after cancer treatment  POSTOPERATIVE DIAGNOSES:  same  PROCEDURE: Left breast reduction  SURGEON: Talmadge Coventry, MD  ASSISTANT: Verdie Shire, PA The advanced practice practitioner (APP) assisted throughout the case.  The APP was essential in retraction and counter traction when needed to make the case progress smoothly.  This retraction and assistance made it possible to see the tissue planes for the procedure.  The assistance was needed for hemostasis, tissue re-approximation and closure of the incision site.   ANESTHESIA:  General.   COMPLICATIONS: None.   INDICATIONS FOR PROCEDURE:  The patient, Paula [name] is a 75 y.o. female born on 1946/06/20, is here for treatment of breast asymmetry after cancer treatment MRN: [id number]  CONSENT:  Informed consent was obtained directly from the patient. Risks, benefits and alternatives were fully discussed. Specific risks including but not limited to bleeding, infection, hematoma, seroma, scarring, pain, contracture, asymmetry, wound healing problems, and need for further surgery were all discussed. The patient did have an ample opportunity to have questions answered to satisfaction.   DESCRIPTION OF PROCEDURE:  The patient was taken to the operating room. SCDs were placed and antibiotics were given.  General anesthesia was administered.  The patient's operative site was prepped and draped in a sterile fashion. A time out was performed and all information was confirmed to be correct.  Patient had been marked preoperatively in the standing position.  Nipple areolar complex and areola to fold measurements were measured out to be equal on both sides.  This was a Wise pattern skin excision with a superomedial pedicle.  The nipple was marked with a 42 mm cookie cutter.  Pedicle was drawn out to be greater than 10  cm at the base.  100 cc of tumescent solution was infiltrated for help with hemostasis.  Breast tourniquet was applied and the pedicle was de-epithelialized with a 10 blade.  The remainder the incisions were made with a 10 blade.  Excision was then performed after isolating the pedicle down to the chest wall.  This yielded 289 g of tissue that was removed.  Patient was then temporarily stapled closed and set up to check shape size and symmetry which were appropriate.  Minor modifications were made.  Closure was then done with buried 3-0 PDS for the vertical limb, buried 4-0 Monocryl for the periareolar limb, and buried Enzor staples for the inframammary incision.  A 3 oh V-Loc was then run throughout.  Steri-Strips and compressive dressing were applied.  The patient tolerated the procedure well.  There were no complications. The patient was allowed to wake from anesthesia, extubated and taken to the recovery room in satisfactory condition.

## 2021-08-05 NOTE — Interval H&P Note (Signed)
Patient seen and examined. Risks and benefits discussed. Proceed with surgery.

## 2021-08-05 NOTE — Telephone Encounter (Signed)
Called patient on 08/04/2021 to discuss her symptoms.  She reports that her cough has been present for the past 4 to 5 weeks, reports this developed after a case of bronchitis.  She reports it is not worsening.  She is not having any abnormal or discolored phlegm.  She is feeling well.  She reports that she went to her PCP for abdominal pain and has been on nitrofurantoin for a bladder infection, reports after starting nitrofurantoin this is significantly improved.  She is not having any current urinary symptoms.  She did have a urine culture which was negative.  She is not having any infectious symptoms.  She reports that she noticed a small boil on her ear, reports that this is not causing her any issues at this point.  She did have a history of this in the past as well.  She also reports that she has had nosebleeds recently, attributes this to the cooler weather and drier air.  Otherwise feeling well.  She has stopped her Eliquis.  We discussed continuing with surgery versus postponing surgery and patient at this time feels comfortable moving forward as long as her symptoms remain unchanged.  Given the improvement in all of her symptoms, think this is reasonable.  Have discussed this with Dr. Claudia Desanctis as well.

## 2021-08-05 NOTE — Anesthesia Preprocedure Evaluation (Addendum)
Anesthesia Evaluation  Patient identified by MRN, date of birth, ID band Patient awake    Reviewed: Allergy & Precautions, NPO status , Patient's Chart, lab work & pertinent test results  Airway Mallampati: II  TM Distance: >3 FB Neck ROM: Full    Dental no notable dental hx.    Pulmonary COPD, former smoker,    Pulmonary exam normal breath sounds clear to auscultation       Cardiovascular hypertension, Pt. on medications Normal cardiovascular exam+ dysrhythmias Atrial Fibrillation  Rhythm:Regular Rate:Normal     Neuro/Psych negative neurological ROS     GI/Hepatic Neg liver ROS, GERD  ,  Endo/Other    Renal/GU negative Renal ROS     Musculoskeletal  (+) Arthritis , Osteoarthritis,    Abdominal (+) + obese,   Peds  Hematology negative hematology ROS (+)   Anesthesia Other Findings   Reproductive/Obstetrics                            Anesthesia Physical  Anesthesia Plan  ASA: 3  Anesthesia Plan: General   Post-op Pain Management:    Induction: Intravenous  PONV Risk Score and Plan: 3 and Ondansetron, Dexamethasone, Midazolam and Treatment may vary due to age or medical condition  Airway Management Planned: LMA  Additional Equipment:   Intra-op Plan:   Post-operative Plan: Extubation in OR  Informed Consent: I have reviewed the patients History and Physical, chart, labs and discussed the procedure including the risks, benefits and alternatives for the proposed anesthesia with the patient or authorized representative who has indicated his/her understanding and acceptance.       Plan Discussed with: CRNA  Anesthesia Plan Comments:        Anesthesia Quick Evaluation

## 2021-08-05 NOTE — Anesthesia Procedure Notes (Signed)
Procedure Name: LMA Insertion Date/Time: 08/05/2021 9:01 AM Performed by: Glory Buff, CRNA Pre-anesthesia Checklist: Patient identified, Emergency Drugs available, Suction available and Patient being monitored Patient Re-evaluated:Patient Re-evaluated prior to induction Oxygen Delivery Method: Circle system utilized Preoxygenation: Pre-oxygenation with 100% oxygen Induction Type: IV induction LMA: LMA inserted LMA Size: 4.0 Number of attempts: 1 Placement Confirmation: positive ETCO2 Tube secured with: Tape Dental Injury: Teeth and Oropharynx as per pre-operative assessment

## 2021-08-05 NOTE — Transfer of Care (Signed)
Immediate Anesthesia Transfer of Care Note  Patient: Clovis Pu  Procedure(s) Performed: UNILATERAL BREAST REDUCTION (Left: Breast)  Patient Location: PACU  Anesthesia Type:General  Level of Consciousness: drowsy, patient cooperative and responds to stimulation  Airway & Oxygen Therapy: Patient Spontanous Breathing and Patient connected to face mask oxygen  Post-op Assessment: Report given to RN and Post -op Vital signs reviewed and stable  Post vital signs: Reviewed and stable  Last Vitals:  Vitals Value Taken Time  BP    Temp    Pulse    Resp    SpO2      Last Pain:  Vitals:   08/05/21 0731  TempSrc: Oral  PainSc: 0-No pain         Complications: No notable events documented.

## 2021-08-05 NOTE — Anesthesia Postprocedure Evaluation (Signed)
Anesthesia Post Note  Patient: Paula [name]  Procedure(s) Performed: UNILATERAL BREAST REDUCTION (Left: Breast)     Patient location during evaluation: PACU Anesthesia Type: General Level of consciousness: awake and alert and oriented Pain management: pain level controlled Vital Signs Assessment: post-procedure vital signs reviewed and stable Respiratory status: spontaneous breathing, nonlabored ventilation and respiratory function stable Cardiovascular status: blood pressure returned to baseline and stable Postop Assessment: no apparent nausea or vomiting Anesthetic complications: no   No notable events documented.  Last Vitals:  Vitals:   08/05/21 1017 08/05/21 1030  BP: (!) 142/71 (!) 147/72  Pulse: 64 60  Resp: 16 12  Temp: 36.6 C   SpO2: 100% 96%    Last Pain:  Vitals:   08/05/21 1042  TempSrc:   PainSc: 3                  Paula [name] A.

## 2021-08-05 NOTE — Discharge Instructions (Addendum)
Activity As tolerated: NO showers for 3 days. Keep ACE wrap on breasts until then. After showering, put ACE wrap back on, this is important for compression. NO driving while in pain, taking pain medication or if you are unable to safely react to traffic. No heavy activities Take Pain medication (Norco) as needed for severe pain. Otherwise, you can use ibuprofen or tylenol as needed. Avoid more than 3,000 mg of tylenol in 24 hours. Norco has 325mg  of tylenol per dose.  Diet: Regular. Drink plenty of fluids and eat healthy, high protein, low carbs.  Wound Care: Keep dressing clean & dry. You may change bandages after showering if you continue to notice some drainage. You can reuse bandages if they are not dirty/soiled.  Special Instructions: Call Doctor if any unusual problems occur such as pain, excessive Bleeding, unrelieved Nausea/vomiting, Fever &/or chills  Follow-up appointment: Scheduled for next week.   Post Anesthesia Home Care Instructions  Activity: Get plenty of rest for the remainder of the day. A responsible individual must stay with you for 24 hours following the procedure.  For the next 24 hours, DO NOT: -Drive a car -Paediatric nurse -Drink alcoholic beverages -Take any medication unless instructed by your physician -Make any legal decisions or sign important papers.  Meals: Start with liquid foods such as gelatin or soup. Progress to regular foods as tolerated. Avoid greasy, spicy, heavy foods. If nausea and/or vomiting occur, drink only clear liquids until the nausea and/or vomiting subsides. Call your physician if vomiting continues.  Special Instructions/Symptoms: Your throat may feel dry or sore from the anesthesia or the breathing tube placed in your throat during surgery. If this causes discomfort, gargle with warm salt water. The discomfort should disappear within 24 hours.  If you had a scopolamine patch placed behind your ear for the management of post-  operative nausea and/or vomiting:  1. The medication in the patch is effective for 72 hours, after which it should be removed.  Wrap patch in a tissue and discard in the trash. Wash hands thoroughly with soap and water. 2. You may remove the patch earlier than 72 hours if you experience unpleasant side effects which may include dry mouth, dizziness or visual disturbances. 3. Avoid touching the patch. Wash your hands with soap and water after contact with the patch.

## 2021-08-06 ENCOUNTER — Encounter (HOSPITAL_BASED_OUTPATIENT_CLINIC_OR_DEPARTMENT_OTHER): Payer: Self-pay | Admitting: Plastic Surgery

## 2021-08-06 LAB — SURGICAL PATHOLOGY

## 2021-08-14 ENCOUNTER — Other Ambulatory Visit: Payer: Self-pay

## 2021-08-14 ENCOUNTER — Ambulatory Visit (INDEPENDENT_AMBULATORY_CARE_PROVIDER_SITE_OTHER): Payer: Medicare Other | Admitting: Plastic Surgery

## 2021-08-14 DIAGNOSIS — N6489 Other specified disorders of breast: Secondary | ICD-10-CM

## 2021-08-14 DIAGNOSIS — Z853 Personal history of malignant neoplasm of breast: Secondary | ICD-10-CM

## 2021-08-14 NOTE — Progress Notes (Signed)
Patient is here 1 week postop from a left breast reduction for symmetry.  She is overall happy and feels like things are going well.  On exam she does have a bit of bruising and swelling but nothing unusual.  Incisions are all intact.  Skin looks viable.  Nipple areolar complex is viable.  We will plan to continue compressive garments and avoid strenuous activity and see her again in 2 weeks.  All of her questions were answered.

## 2021-08-28 ENCOUNTER — Other Ambulatory Visit: Payer: Self-pay

## 2021-08-28 ENCOUNTER — Ambulatory Visit (INDEPENDENT_AMBULATORY_CARE_PROVIDER_SITE_OTHER): Payer: Medicare Other | Admitting: Surgical

## 2021-08-28 DIAGNOSIS — N6489 Other specified disorders of breast: Secondary | ICD-10-CM

## 2021-08-28 DIAGNOSIS — Z853 Personal history of malignant neoplasm of breast: Secondary | ICD-10-CM

## 2021-08-28 NOTE — Progress Notes (Signed)
Patient is a 75 year old female status post left breast reduction with Dr.  Claudia Desanctis on 08/05/2021.  Patient is 3 weeks postop. She is doing really well.  She is very pleased with her recovery.  Reports a little bit of tenderness in the left axilla.she is not having any infectious symptoms.  She reports that she has been fairly active at home and has not had any issues.  Chaperone present on exam On exam left NAC is viable, left breast incisions are intact and healing well.  There is no erythema or cellulitic changes noted.  There is some residual yellowing bruising that is noted along the left lateral breast.  I do not notice any subcutaneous fluid collections with palpation, however she does have some visible swelling.  Recommend continuing with compressive garment 24/7 until 6 weeks post-op Recommend avoiding strenuous activity/heavy lifting until 6 weeks post-op We discussed scheduling a follow-up in 1 month, however patient feels comfortable at this point and would like to follow-up as needed. Recommend calling with any questions or concerns. Pictures were obtained of the patient and placed in the chart with the patient's or guardian's permission.

## 2022-03-12 ENCOUNTER — Other Ambulatory Visit: Payer: Self-pay | Admitting: Surgery

## 2022-03-12 DIAGNOSIS — Z1231 Encounter for screening mammogram for malignant neoplasm of breast: Secondary | ICD-10-CM

## 2022-03-23 ENCOUNTER — Ambulatory Visit: Payer: Medicare Other

## 2022-04-20 ENCOUNTER — Ambulatory Visit
Admission: RE | Admit: 2022-04-20 | Discharge: 2022-04-20 | Disposition: A | Discharge status: Home / Self Care | Payer: Medicare Other | Source: Ambulatory Visit | Attending: Surgery | Admitting: Surgery

## 2022-04-20 DIAGNOSIS — Z1231 Encounter for screening mammogram for malignant neoplasm of breast: Secondary | ICD-10-CM

## 2023-03-31 ENCOUNTER — Other Ambulatory Visit: Payer: Self-pay | Admitting: Family Medicine

## 2023-03-31 DIAGNOSIS — Z1231 Encounter for screening mammogram for malignant neoplasm of breast: Secondary | ICD-10-CM

## 2023-04-29 ENCOUNTER — Ambulatory Visit
Admission: RE | Admit: 2023-04-29 | Discharge: 2023-04-29 | Disposition: A | Discharge status: Home / Self Care | Payer: Medicare Other | Source: Ambulatory Visit | Attending: Family Medicine | Admitting: Family Medicine

## 2023-04-29 DIAGNOSIS — Z1231 Encounter for screening mammogram for malignant neoplasm of breast: Secondary | ICD-10-CM
